# Patient Record
Sex: Female | Born: 1960 | Race: White | Hispanic: No | Marital: Married
Health system: Midwestern US, Community
[De-identification: ages and names within clinical notes are randomized; demographics above are authoritative.]

## PROBLEM LIST (undated history)

## (undated) DIAGNOSIS — M810 Age-related osteoporosis without current pathological fracture: Secondary | ICD-10-CM

## (undated) DIAGNOSIS — M62838 Other muscle spasm: Secondary | ICD-10-CM

## (undated) DIAGNOSIS — M159 Polyosteoarthritis, unspecified: Secondary | ICD-10-CM

## (undated) DIAGNOSIS — Z1239 Encounter for other screening for malignant neoplasm of breast: Secondary | ICD-10-CM

## (undated) DIAGNOSIS — M797 Fibromyalgia: Secondary | ICD-10-CM

## (undated) DIAGNOSIS — M1990 Unspecified osteoarthritis, unspecified site: Secondary | ICD-10-CM

## (undated) DIAGNOSIS — Z1231 Encounter for screening mammogram for malignant neoplasm of breast: Secondary | ICD-10-CM

## (undated) DIAGNOSIS — M1611 Unilateral primary osteoarthritis, right hip: Secondary | ICD-10-CM

## (undated) DIAGNOSIS — M519 Unspecified thoracic, thoracolumbar and lumbosacral intervertebral disc disorder: Secondary | ICD-10-CM

## (undated) DIAGNOSIS — G629 Polyneuropathy, unspecified: Secondary | ICD-10-CM

## (undated) DIAGNOSIS — E531 Pyridoxine deficiency: Secondary | ICD-10-CM

---

## 2010-01-03 LAB — CBC WITH AUTOMATED DIFF
ABS. BASOPHILS: 0.1 10*3/uL (ref 0.0–0.1)
ABS. EOSINOPHILS: 0.1 10*3/uL (ref 0.0–0.5)
ABS. LYMPHOCYTES: 1.7 10*3/uL (ref 0.8–3.5)
ABS. MONOCYTES: 0.4 10*3/uL — ABNORMAL LOW (ref 0.8–3.5)
ABS. NEUTROPHILS: 3 10*3/uL (ref 1.5–8.0)
BASOPHILS: 1 % (ref 0–2)
EOSINOPHILS: 2 % (ref 0–5)
HCT: 43 % (ref 41–53)
HGB: 14.6 g/dL (ref 12.0–16.0)
LYMPHOCYTES: 33 % (ref 19–48)
MCH: 30 PG (ref 27–31)
MCHC: 34 g/dL (ref 31–37)
MCV: 88.2 FL (ref 80–100)
MONOCYTES: 8 % (ref 3–9)
MPV: 8.2 FL (ref 5.9–10.3)
NEUTROPHILS: 56 % (ref 40–74)
PLATELET: 254 10*3/uL (ref 130–400)
RBC: 4.88 M/uL (ref 4.2–5.4)
RDW: 14.3 % (ref 11.5–14.5)
WBC: 5.3 10*3/uL (ref 4.5–10.8)

## 2010-01-03 LAB — METABOLIC PANEL, COMPREHENSIVE
A-G Ratio: 1.2 (ref 1.2–2.2)
ALT (SGPT): 38 U/L (ref 30–65)
AST (SGOT): 19 U/L (ref 15–37)
Albumin: 4.4 g/dL (ref 3.4–5.0)
Alk. phosphatase: 57 U/L (ref 50–136)
Anion gap: 9 mmol/L (ref 6–15)
BUN/Creatinine ratio: 10 (ref 7–25)
BUN: 8 MG/DL (ref 7–18)
Bilirubin, total: 0.7 MG/DL (ref 0.2–1.0)
CO2: 28 MMOL/L (ref 21–32)
Calcium: 9.4 MG/DL (ref 8.5–10.1)
Chloride: 103 MMOL/L (ref 98–107)
Creatinine: 0.8 MG/DL (ref 0.6–1.3)
GFR est AA: >60 mL/min/{1.73_m2} (ref >60)
GFR est non-AA: >60 mL/min/{1.73_m2} (ref >60)
Globulin: 3.6 g/dL — ABNORMAL HIGH (ref 2.4–3.5)
Glucose: 88 MG/DL (ref 70–110)
Potassium: 3.9 MMOL/L (ref 3.5–5.1)
Protein, total: 8 g/dL (ref 6.4–8.2)
Sodium: 140 MMOL/L (ref 136–145)

## 2010-01-03 LAB — THYROID PANEL W/TSH
Free thyroxine index: 2.4 (ref 1.4–3.1)
T3 Uptake: 29 % (ref 22.5–37)
T4, Total: 8.3 ug/dL (ref 4.5–10.9)
TSH,2nd Generation: 1.6 u[IU]/mL (ref 0.35–5.50)

## 2010-01-03 LAB — CHOLESTEROL, TOTAL: Cholesterol, total: 208 MG/DL — ABNORMAL HIGH (ref <200)

## 2010-01-03 NOTE — Op Note (Unsigned)
[facility]      PT Name: Rachael, Brown Admitted: 01/03/2010  MR#: [id number] DOB: [birth year]  Account #: [id number] Age: 49  Dictator: Joretta Bachelor, M.D. Location:          OPERATIVE REPORT    SURGERY DATE: 01/03/2010    PREOPERATIVE DIAGNOSIS:  1. Patient for screening colonoscopy.  2. Average risk.    POSTOPERATIVE DIAGNOSIS:  1. A 4 mm polyp at 30 cm, removed with biopsy forceps.  2. Internal hemorrhoids.  3. Slightly prominent ileocecal valve, biopsied.    OPERATION:  Colonoscopy to cecum with biopsy and removal of the polyp and biopsy  of the ileocecal valve.    ENDOSCOPIST: Dr. Lyndee Leo.  ANESTHESIA: MAC.    PERTINENT HISTORY T INDICATIONS FOR PROCEDURE:  This 49 year old female was seen by me for screening colonoscopy.  She is considered average risk.    PROCEDURE T FINDINGS:  The patient was kept in the left lateral position. She was sedated  by the anesthesiologist.    A rectal examination was done and this was normal. Olympus  videoendoscope was advanced into the rectum and sigmoid colon. The  preparation was adequate. Clear liquid stool was present and it  could be suctioned out. Carefully the scope was manipulated all the  way to the cecum through a slightly redundant colon. The ileocecal  valve appeared slightly prominent. Whether there was a small  submucosal lesion in this area or not, I could not be sure.  Biopsies were taken from the surface. The tissue felt soft and most  likely is slightly prominent ileocecal valve. The appendicular  orifice was identified and thereby the fact that the location was  the cecum was confirmed. The scope was withdrawn. I did not see  the openings of any diverticula. At 30 cm there was a small polyp  about 4 mm in size. This was removed using cold biopsy forceps.  The scope was withdrawn and the anorectal area was inspected by  retroflexion. Small internal hemorrhoids were noticed. After  decompressing the colon, the procedure was terminated.       Appropriate postoperative instructions were given. Periodic  surveillance will be recommended depending on the biopsy findings if  adenomatous tissue is found in the polyp.         Electronically signed by Markus Daft, M.D.  on 01/08/2010 21:57:42      _____________________________  Markus Daft, M.D.    RKW:ch DD: 01/03/2010 09:08:54 DT: 01/03/2010 11:23:03 Job ID:  [id number]  CC:

## 2010-01-03 NOTE — Op Note (Unsigned)
[facility]      PT Name: Rachael Brown, Rachael Brown Admitted: 01/03/2010  MR#: [id number] DOB: May 06, 1961  Account #: [id number] Age: 49  Dictator: Joretta Bachelor, M.D. Location:          OPERATIVE REPORT    SURGERY DATE: 01/03/2010    PREOPERATIVE DIAGNOSIS:  1. Patient for screening colonoscopy.  2. Average risk.    POSTOPERATIVE DIAGNOSIS:  1. A 4 mm polyp at 30 cm, removed with biopsy forceps.  2. Internal hemorrhoids.  3. Slightly prominent ileocecal valve, biopsied.    OPERATION:  Colonoscopy to cecum with biopsy and removal of the polyp and biopsy  of the ileocecal valve.    ENDOSCOPIST: Dr. Lyndee Leo.  ANESTHESIA: MAC.    PERTINENT HISTORY T INDICATIONS FOR PROCEDURE:  This 49 year old female was seen by me for screening colonoscopy.  She is considered average risk.    PROCEDURE T FINDINGS:  The patient was kept in the left lateral position. She was sedated  by the anesthesiologist.    A rectal examination was done and this was normal. Olympus  videoendoscope was advanced into the rectum and sigmoid colon. The  preparation was adequate. Clear liquid stool was present and it  could be suctioned out. Carefully the scope was manipulated all the  way to the cecum through a slightly redundant colon. The ileocecal  valve appeared slightly prominent. Whether there was a small  submucosal lesion in this area or not, I could not be sure.  Biopsies were taken from the surface. The tissue felt soft and most  likely is slightly prominent ileocecal valve. The appendicular  orifice was identified and thereby the fact that the location was  the cecum was confirmed. The scope was withdrawn. I did not see  the openings of any diverticula. At 30 cm there was a small polyp  about 4 mm in size. This was removed using cold biopsy forceps.  The scope was withdrawn and the anorectal area was inspected by  retroflexion. Small internal hemorrhoids were noticed. After  decompressing the colon, the procedure was terminated.       The endoscopic findings were discussed with the patient.  Appropriate postoperative instructions were given. Periodic  surveillance will be recommended depending on the biopsy findings if  adenomatous tissue is found in the polyp.        _____________________________  Markus Daft, M.D.    RKW:ch DD: 01/03/2010 09:08:54 DT: 01/03/2010 11:23:03 Job ID:  [id number]  CC:

## 2010-01-06 LAB — TRIGLYCERIDE: Triglyceride: 65 MG/DL (ref <150)

## 2010-01-06 LAB — HDL CHOLESTEROL: HDL Cholesterol: 73 MG/DL (ref 32–96)

## 2010-09-05 NOTE — Procedures (Signed)
Procedures  signed by Toniann Fail at 09/07/10 1327                 Author: Toniann Fail  Service: --  Author Type: Physician       Filed: 09/07/10 1327  Date of Service: 09/05/10 1010  Status: Signed          Editor: Toniann Fail            Procedure Orders        1. 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  She has some neck pain as well.  She had a carpal<BR> tunnel release in 1999 on the right hand.  No history of diabetes.<BR> She is frequently on a keyboard.  She has very pronounced Tinel's<BR> signs, especially on the right compared to the left.  She has had<BR> steroid injections  in her wrists at various times which generally<BR> help with her symptoms except on this last occasion when she had<BR> very little help.<BR> <BR> TECHNICAL INFORMATION:<BR> Bilateral upper extremity nerve conductions and electromyography and<BR> bilateral  cervical paraspinal electromyography was performed.<BR> <BR> RESULTS:<BR> Motor nerves-bilateral median distal motor latencies were normal at<BR> 3.5 milliseconds on the left and 3.8 milliseconds on the right.<BR> Normal is less than 4 milliseconds.   Amplitude on the left was<BR> borderline, on the right was slightly reduced.  Proximal velocities<BR> were normal at 66 meters per second on the  left, 63 meters per<BR> second on the right. Normal velocities are greater than 50 meters<BR> per second.   In contrast, ulnar distal motor latencies were 2.8<BR> milliseconds on the left, 2.6 milliseconds on the right.  Amplitudes<BR> were normal.  Proximal velocities again between the wrist and elbow<BR> were 69 meters per second on the left, 62 meters per  second on the<BR> right.  Velocities across the elbow were 71 meters per second on the<BR> left, 58 meters per second on the right.<BR> <BR> F-studies demonstrated the late responses for both median and ulnar<BR> nerves were between 24-26 milliseconds.   Normal is less than 30.<BR> Sensory peak latencies orthodromically conducted across the wrist<BR> were slightly prolonged for the left median nerve at 2.3<BR> milliseconds.  Normal is 2 milliseconds or less.  The right median<BR> nerve was borderline  at 2.1 milliseconds.  Ulnar nerves were normal<BR> at 2 milliseconds and the radial nerves at 100 mm were normal at 2.4<BR> milliseconds.<BR> <BR>  Electromyography demonstrated bilateral high, mid and low cervical<BR> paraspinal activity was normal.   There was significant tension<BR> there.  Bilateral biceps, pronator teres, abductor pollicis brevis<BR> and first dorsal interossei also had normal spontaneous activity<BR> including amplitude.<BR> <BR> <BR> <BR> <BR> <BR> <BR> <BR> <BR> IMPRESSIONS:<BR>  1. Minimal carpal tunnel syndrome sensory delay on the left.<BR> 2. No supportive evidence of a carpal tunnel syndrome on conductive<BR> basis on the right.<BR> 3. No evidence of a cervical radiculopathy.  Reduced amplitude on<BR> the right carpal tunnel  median nerve suggests a chronic injury to<BR> the nerve.  She appears to have a severe carpal tunnel clinically<BR> but without compromising electrical conduction.  Correlate<BR> clinically.<BR> <BR> <BR> <BR> <BR> ___________________________________<BR>  Toniann Fail, M.D.<BR> <BR> JB:ls  DD:  09/05/2010  10:10:08   DT:  09/05/2010 19:59:58  Job ID:<BR> [id number]<BR> CC:Dr. Warrier<BR> <BR> <BR> <BR> <BR> <BR> <BR> <BR> <BR> Document #:  [id number]<BR> <!--EPICE-->

## 2010-09-05 NOTE — Procedures (Signed)
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 She has some neck pain as well. She had a carpal  tunnel release in 1999 on the right hand. No history of diabetes.  She is frequently on a keyboard. She has very pronounced Tinel's  signs, especially on the right compared to the left. She has had  steroid injections in her wrists at various times which generally  help with her symptoms except on this last occasion when she had  very little help.    TECHNICAL INFORMATION:  Bilateral upper extremity nerve conductions and electromyography and  bilateral cervical paraspinal electromyography was performed.    RESULTS:  Motor nerves-bilateral median distal motor latencies were normal at  3.5 milliseconds on the left and 3.8 milliseconds on the right.  Normal is less than 4 milliseconds. Amplitude on the left was  borderline, on the right was slightly reduced. Proximal velocities  were normal at 66 meters per second on the left, 63 meters per  second on the right. Normal velocities are greater than 50 meters  per second. In contrast, ulnar distal motor latencies were 2.8  milliseconds on the left, 2.6 milliseconds on the right. Amplitudes  were normal. Proximal velocities again between the wrist and elbow  were 69 meters per second on the left, 62 meters per second on the  right. Velocities across the elbow were 71 meters per second on the  left, 58 meters per second on the right.    F-studies demonstrated the late responses for both median and ulnar   nerves were between 24-26 milliseconds. Normal is less than 30.  Sensory peak latencies orthodromically conducted across the wrist  were slightly prolonged for the left median nerve at 2.3  milliseconds. Normal is 2 milliseconds or less. The right median  nerve was borderline at 2.1 milliseconds. Ulnar nerves were normal  at 2 milliseconds and the radial nerves at 100 mm were normal at 2.4  milliseconds.    Electromyography demonstrated bilateral high, mid and low cervical  paraspinal activity was normal. There was significant tension  there. Bilateral biceps, pronator teres, abductor pollicis brevis  and first dorsal interossei also had normal spontaneous activity  including amplitude.                  IMPRESSIONS:  1. Minimal carpal tunnel syndrome sensory delay on the left.  2. No supportive evidence of a carpal tunnel syndrome on conductive  basis on the right.  3. No evidence of a cervical radiculopathy. Reduced amplitude on  the right carpal tunnel median nerve suggests a chronic injury to  the nerve. She appears to have a severe carpal tunnel clinically  but without compromising electrical conduction. Correlate  clinically.          ___________________________________  Toniann Fail, M.D.    JB:ls DD: 09/05/2010 10:10:08 DT: 09/05/2010 19:59:58 Job ID:  [id number]  CC:Dr. Lyndee Leo  Document #: [id number]

## 2010-09-16 MED ORDER — GADOPENTETATE DIMEGLUMINE 7.5 MMOL/15 ML IV SYRINGE
7.5 mmol/15 mL (469.01 mg/mL) | Freq: Once | INTRAVENOUS | Status: AC
Start: 2010-09-16 — End: 2010-09-16
  Administered 2010-09-16: 14:00:00 via INTRAVENOUS

## 2010-09-16 MED FILL — MAGNEVIST 7.5 MMOL/15 ML (469.01 MG/ML) INTRAVENOUS SYRINGE: 7.5 mmol/15 mL (469.01 mg/mL) | INTRAVENOUS | Qty: 15

## 2010-09-16 NOTE — Progress Notes (Signed)
Mri cervical W&WO completed @ 0805. Rachael Brown

## 2010-10-17 LAB — METABOLIC PANEL, BASIC
Anion gap: 7 mmol/L (ref 6–15)
BUN/Creatinine ratio: 16 (ref 7–25)
BUN: 11 MG/DL (ref 7–18)
CO2: 27 MMOL/L (ref 21–32)
Calcium: 8.7 MG/DL — ABNORMAL LOW (ref 9.0–10.7)
Chloride: 107 MMOL/L (ref 98–107)
Creatinine: 0.7 MG/DL (ref 0.6–1.3)
GFR est AA: >60 mL/min/{1.73_m2} (ref >60)
GFR est non-AA: >60 mL/min/{1.73_m2} (ref >60)
Glucose: 84 MG/DL (ref 70–110)
Potassium: 3.8 MMOL/L (ref 3.5–5.3)
Sodium: 141 MMOL/L (ref 136–145)

## 2010-10-17 LAB — CBC WITH AUTOMATED DIFF
ABS. BASOPHILS: 0 10*3/uL (ref 0.0–0.1)
ABS. EOSINOPHILS: 0.1 10*3/uL (ref 0.0–0.5)
ABS. LYMPHOCYTES: 1.8 10*3/uL (ref 0.8–3.5)
ABS. MONOCYTES: 0.6 10*3/uL — ABNORMAL LOW (ref 2.0–8.0)
ABS. NEUTROPHILS: 3.6 10*3/uL (ref 1.5–8.0)
BASOPHILS: 1 % (ref 0–2)
EOSINOPHILS: 1 % (ref 0–5)
HCT: 42 % (ref 41–53)
HGB: 13.4 g/dL (ref 12.0–16.0)
LYMPHOCYTES: 30 % (ref 19–48)
MCH: 28.9 PG (ref 27–31)
MCHC: 31.9 g/dL (ref 31–37)
MCV: 90.5 FL (ref 80–100)
MONOCYTES: 9 % (ref 3–9)
MPV: 10.5 FL — ABNORMAL HIGH (ref 5.9–10.3)
NEUTROPHILS: 59 % (ref 40–74)
PLATELET: 240 10*3/uL (ref 130–400)
RBC: 4.64 M/uL (ref 4.2–5.4)
RDW: 13.8 % (ref 11.5–14.5)
WBC: 6 10*3/uL (ref 4.5–10.8)

## 2010-10-17 LAB — EKG, 12 LEAD, INITIAL
Atrial Rate: 71 {beats}/min
Calculated P Axis: 10 degrees
Calculated R Axis: -10 degrees
Calculated T Axis: 51 degrees
Diagnosis: NORMAL
P-R Interval: 156 ms
Q-T Interval: 404 ms
QRS Duration: 76 ms
QTC Calculation (Bezet): 439 ms
Ventricular Rate: 71 {beats}/min

## 2010-10-17 LAB — PROTHROMBIN TIME + INR
INR: 1 (ref 0.9–1.1)
Prothrombin time: 13.1 s (ref 11.3–14.6)

## 2010-10-17 LAB — PTT: aPTT: 29.3 s (ref 22.6–40.3)

## 2010-10-17 NOTE — Progress Notes (Signed)
CHEST XRAY COMPLETED @ 1000  Rachael Brown

## 2010-10-20 ENCOUNTER — Observation Stay
Admit: 2010-10-20 | Discharge: 2010-10-21 | Disposition: A | Discharge status: Home / Self Care | Payer: BLUE CROSS/BLUE SHIELD | Source: Ambulatory Visit | Attending: Neurological Surgery | Admitting: Neurological Surgery

## 2010-10-20 DIAGNOSIS — M5000 Cervical disc disorder with myelopathy, unspecified cervical region: Secondary | ICD-10-CM

## 2010-10-20 LAB — BLOOD TYPE, (ABO+RH)
ABO/Rh(D): A POS
ABO/Rh: A POS

## 2010-10-20 MED ORDER — BACITRACIN 50,000 UNIT IM
50000 unit | Freq: Once | INTRAMUSCULAR | Status: AC
Start: 2010-10-20 — End: 2010-10-20
  Administered 2010-10-20: 21:00:00

## 2010-10-20 MED ORDER — ONDANSETRON (PF) 4 MG/2 ML INJECTION
4 mg/2 mL | INTRAMUSCULAR | Status: AC | PRN
Start: 2010-10-20 — End: 2010-10-20
  Administered 2010-10-20: 20:00:00 via INTRAVENOUS

## 2010-10-20 MED ORDER — SODIUM CHLORIDE 0.9 % IRRIGATION SOLN
0.9 % | Freq: Once | Status: AC
Start: 2010-10-20 — End: 2010-10-20
  Administered 2010-10-20: 21:00:00

## 2010-10-20 MED ORDER — MIDAZOLAM 1 MG/ML IJ SOLN
1 mg/mL | INTRAMUSCULAR | Status: DC | PRN
Start: 2010-10-20 — End: 2010-10-20
  Administered 2010-10-20: 20:00:00 via INTRAVENOUS

## 2010-10-20 MED ORDER — LACTATED RINGERS IV
INTRAVENOUS | Status: DC
Start: 2010-10-20 — End: 2010-10-20
  Administered 2010-10-20: 18:00:00 via INTRAVENOUS

## 2010-10-20 MED ORDER — SODIUM CHLORIDE 0.9 % IV PIGGY BACK
1 gram | Freq: Once | INTRAVENOUS | Status: AC
Start: 2010-10-20 — End: 2010-10-20
  Administered 2010-10-20: 20:00:00 via INTRAVENOUS

## 2010-10-20 MED FILL — LACTATED RINGERS IV: INTRAVENOUS | Qty: 1000

## 2010-10-20 MED FILL — SODIUM CHLORIDE 0.9 % IRRIGATION SOLN: 0.9 % | Qty: 500

## 2010-10-20 MED FILL — BACITRACIN 50,000 UNIT IM: 50000 unit | INTRAMUSCULAR | Qty: 50000

## 2010-10-20 MED FILL — FENTANYL CITRATE (PF) 50 MCG/ML IJ SOLN: 50 mcg/mL | INTRAMUSCULAR | Qty: 5

## 2010-10-20 MED FILL — ONDANSETRON (PF) 4 MG/2 ML INJECTION: 4 mg/2 mL | INTRAMUSCULAR | Qty: 2

## 2010-10-20 MED FILL — PROPOFOL 10 MG/ML IV EMUL: 10 mg/mL | INTRAVENOUS | Qty: 20

## 2010-10-20 MED FILL — HYDROMORPHONE (PF) 1 MG/ML IJ SOLN: 1 mg/mL | INTRAMUSCULAR | Qty: 1

## 2010-10-20 MED FILL — SODIUM CHLORIDE 0.9 % IV PIGGY BACK: INTRAVENOUS | Qty: 50

## 2010-10-20 MED FILL — SODIUM CHLORIDE 0.9 % IRRIGATION SOLN: 0.9 % | Qty: 1500

## 2010-10-20 MED FILL — LIDOCAINE (PF) 20 MG/ML (2 %) IV SYRINGE: 100 mg/5 mL (2 %) | INTRAVENOUS | Qty: 5

## 2010-10-20 MED FILL — ROCURONIUM 10 MG/ML IV: 10 mg/mL | INTRAVENOUS | Qty: 5

## 2010-10-20 MED FILL — MIDAZOLAM 1 MG/ML IJ SOLN: 1 mg/mL | INTRAMUSCULAR | Qty: 2

## 2010-10-20 NOTE — Op Note (Signed)
Op Notes signed by  at 10/26/10 1624                  Author: Nolon Nations  Service: --  Author Type: Physician       Filed: 10/26/10 1624  Date of Service: 10/20/10 2316  Status: Signed          Editor: Nolon Nations          <!--EPICS-->                           [facility]<BR> <BR> <BR> PT Name:  Rachael Brown, Rachael Brown      Admitted:  10/20/2010<BR> MR#:  [id number]                      DOB:  [birth year]<BR> Account #:  [id number]            Age:  49<BR> Dictator:  Nolon Nations, M.D.       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 C3-4 anterior  cervical discectomy with arthrodesis using right<BR> iliac crest  graft with Cornerstone HSR cage and Atlantis Vision<BR> plating<BR> 2. C5-6 anterior cervical discectomy with arthrodesis using right<BR> iliac crest  graft with Cornerstone HSR cage and  separate plating<BR> 3. Neural Integrity Monitoring times 6 1/2 hours of the vocal cords<BR> <BR> SURGEON:  Dr. Shela Commons. Lytle Malburg<BR> ANES:   General endotracheal anesthesia<BR> EBL:  225 cc<BR> CONDITION:  The patient tolerated the procedure well and was taken<BR>  to the Recovery Room with notable improvement in strength in both<BR> upper extremities and moving all extremities well.<BR> <BR> INDICATIONS FOR PROCEDURE:<BR> This middle aged [place] female has had three years of progressive<BR> symptoms but getting  medically worse in the last 3-6 months with<BR> having to use two hands to hold her coffee cup, progressive weakness<BR> in both arms and ataxic gait.  Work up revealed cord compression at<BR> C3-5 consistent with C5-8 on one side and C6-8 on the other  side<BR> with neurological  deficits in these zones with impaired<BR> proprioception and difficulty with tandem walking and positive<BR> Romberg.   The risks, benefits, alternatives and complications of<BR> surgical management were explained to the patient  and her husband.<BR> She elected to proceed accordingly.  With normal bone mass she<BR> elected autograft over alllograft.  No guarantees regarding results<BR> were given.<BR> <BR> DESCRIPTION OF OPERATIVE PROCEDURE:<BR> The patient was taken to the Operating  Room and placed under General<BR> endotracheal anesthesia.  Neural integrity monitoring was<BR> established through the endotracheal tube and found to be normal<BR> throughout the entire monitoring period.   Foley catheter,<BR> sequential compression  boots and pulse oximetry monitoring were<BR> placed.  Shoulders were lightly taped down for intraoperative<BR> visualization.   The patient had previous history of rotator cuff<BR> repair and thus light taping only was performed.  The shoulders were<BR>  untaped after initial cross table fluoroscopy.  The neck and right<BR> iliac crest area were prepped thoroughly with Betadine containing<BR> solution and draped in sterile fashion.<BR> <BR> After surgical pause was observed an incision was made in the  normal<BR> nuchal fold overlying the area of interest from midline to past the<BR> sternocleidomastoid border on the right hand side.  The incision was<BR> carried down through subcutaneous tissue.  The platysma muscle  was<BR> incised.   The anterior  border of the sternocleidomastoid muscle was<BR> identified and incised.   Blunt dissection was carried past the<BR> carotid sheath.  Several bridging veins were bipolar cauterized and<BR> divided.  The prevertebral fascia was opened from C3 to C6.    The<BR> longus coli muscle was elevated bilaterally from C3-4 and from C5-6.<BR> Self-retaining Shadow-Line retractor was inserted underneath the<BR> longus coli muscle and was adjusted for each  level accordingly.<BR> Then, 12 mm bone wax coated Caspar  distractor pins were placed into<BR> the respective bodies of C3-4 and C5-6.  These pins were not buried<BR> due to the shallow nature of the small vertebral bodies.  The<BR> confirmatory radiograph showed the pins in good position.<BR> <BR> Starting  first at C3-4 the disc, which was degenerative and narrowed<BR> in nature, was incised after distracting the disc partially.   The<BR> disc was removed in a piecemeal fashion back to the posterior narrow<BR> aspect of the disc.    Attention was then directed  to the C5-6 level<BR> where the process was repeated.   This disc was irregular and<BR> narrowed as well.  It had a lordotic curve within the disc itself.<BR> Bone was removed from the anterior osteophytes, preserving some bone<BR> intact for later screw  placement.   The disc had been removed to<BR> approximately two-thirds of the way because of the contour change of<BR> the disc.   The disc was then drilled on the bone edges to form<BR> parallel cuts.   Extensive bone bridging was noted after the<BR>  posterior aspect of the disc was encountered.   These were drilled<BR> thin with Midas Rex drilling.   Decompression corresponded to<BR> findings on the MRI with extensive bony changes in the ligament,<BR> sometimes extending behind the vertebral body  on both sides.  This<BR> required extensive drilling of osteophytes overlying said ossified<BR> ligaments until the ligament was completely free and all ossified<BR> and thickened portion of the ligament and the dura, though still<BR> compressed, until  all of these components were completely removed.<BR> Bilateral foraminotomies were completed so that a 3.0 angle curette<BR> could pass along the anterior margin of the nerve root superiorly,<BR> laterally and inferiorly.<BR> <BR> Attention was then directed  back to the C3-4 disc where the<BR> posterior aspect of the disc was removed under Leica OH4  microscope<BR> as had been detailed on the previously aforementioned disc.   Midas<BR> Rex drilling was used to reduce the osteophytes extending off of<BR> both  vertebral bodies which were more extensive centrally and to the<BR> right side.   The foramen were opened bilaterally.  There was<BR> extensive ossification compressing the right foramen.   This was<BR> thoroughly decompressed.  Any dural compression  was completely<BR> removed.   Epidural oozing was controlled with the use of judicious<BR> amounts of Avitene.   All irrigation returned clear.  End-plate<BR> preparation had been made.<BR> <BR> A separate incision was made over the iliac crest on the  right side<BR> and carried down to subcutaneous tissue.   Superficial fascia and<BR> deep fascia were incised.   The cortex was opened and adequate<BR> cancellous bone to pack two separate cages was obtained and placed<BR> in Bacitracin containing solution.   A 6 x 11 x 14 mm Cornerstone HSR<BR> cage was packed to over-flowing with cancellous bone.   It was<BR> tamped gently but firmly into position.  Attempts to pry it loose<BR> once the distractor pins were released were unsuccessful.   The two<BR> pins  were removed and bone wax applied to the  holes.   A 21 mm<BR> Atlantis Vision plate was attached to the vertebral bodies with<BR> slight contouring because of the slip-off of the C3 vertebral body.<BR> Because of the small size of the vertebral bodies,  11 mm x 4.0 mm<BR> variable angle screws were used.<BR> A pilot hole was made with an 11 mm drill bit.  Two screws were<BR> placed per level and the locking mechanisms were engaged.  Attempts<BR> to back out any of the cancellous screws were unsuccessful.<BR>  <BR> Attention was then directed back to the C5-6 area.  After attaining<BR> end-plate preparation, a second 6 x 11 x 14 mm Cornerstone HSR cage<BR> was packed to over-flowing.   This was tamped gently but firmly into<BR> position.  Attempts to pry it   loose were unsuccessful.  Initial<BR> placement of the screws showed the 21 mm Atlantis Vision plate was<BR> not fitting well against the vertebral bodies.   These screws were<BR> then removed in order to gain access to the left C5 screw and right<BR>  C6 screw.  Then, 13 mm drill was used.  This was done under<BR> fluoroscopy to make sure that it did not engage beyond the bony<BR> limits.  This allowed for placement of a second set of 4.0 x 11 mm<BR> variable angle screws into the vertebral bodies  of C5-6<BR> respectively.   The locking mechanisms were engaged and attempts to<BR> back out any of the cancellous screws were unsuccessful.<BR> Examination of the esophagus was performed and showed to be intact.<BR> <BR> Both wounds were thoroughly irrigated  with Bacitracin containing<BR> solution.  The hip wound was closed without need of a drain in<BR> anatomical layers including 4-0 subcuticular closure.  The cervical<BR> wound was closed over a separate stab site for a 7 mm flat<BR> Jackson-Pratt drain  taken from the prevertebral fascia out to it's<BR> own bulb suction.  The wound was then closed in anatomical layers of<BR> Vicryl including 4-0 subcuticular closure.   Steri-Strips and<BR> dressing were applied to both wounds.  Soft collar was applied.<BR>  The patient was extubated with notable improvement in bilateral<BR> upper extremity strength in the Recovery Room.    Sponge and needle<BR> counts were correct.  Estimated blood loss was 225 cc.<BR> <BR> <BR> <BR> <BR> <BR> <BR> <BR> <BR> <BR> _____________________________<BR>  Nolon Nations, M.D.<BR> <BR> JP:klg DD: 10/20/2010 23:16:52  DT: 10/21/2010 11:38:03 Job ID:<BR> [id number]<BR> CC:<BR> <BR> <BR> Document #:  [id number]<BR> <!--EPICE-->

## 2010-10-20 NOTE — Progress Notes (Signed)
Post-Anesthesia Evaluation & Assessment    Visit Vitals   Item Reading   ??? BP 142/93   ??? Pulse 101   ??? Temp 98.4 ??F (36.9 ??C)   ??? Resp 22   ??? Ht 5\' 2"  (1.575 m)   ??? Wt 175 lb (79.379 kg)   ??? BMI 32.01 kg/m2   ??? SpO2 100%         Nausea/Vomiting: no nausea    Post-operative hydration adequate.    Pain score (VAS): 1    Mental status & Level of consciousness: alert and oriented x 3    Neurological status: moves all extremities, sensation grossly intact    Pulmonary status: airway patent, no supplemental oxygen required    Complications related to anesthesia: none    Patient has met all discharge requirements.    Additional comments:        [place] Circle, MD

## 2010-10-20 NOTE — Other (Signed)
TRANSFER - OUT REPORT:    Verbal report given to angie R.N.(name) on Rachael Brown  being transferred to 4c(unit) for routine progression of care       Report consisted of patient???s Situation, Background, Assessment and   Recommendations(SBAR).     Information from the following report(s) SBAR was reviewed with the receiving nurse.    Opportunity for questions and clarification was provided.

## 2010-10-20 NOTE — Op Note (Signed)
[facility]      PT Name: Rachael Brown, Rachael Brown Admitted: 10/20/2010  MR#: [id number] DOB: [birth year]  Account #: [id number] Age: 50  Dictator: Nolon Nations, M.D. Location:      OPERATIVE REPORT    SURGERY DATE: 10/20/2010    PREOPERATIVE DIAGNOSIS:  Cervical degenerative disc disease, cervical herniated nucleus  pulposus, cervical myelopathy    POSTOPERATIVE DIAGNOSIS:  Cervical degenerative disc disease, cervical herniated nucleus  pulposus, cervical myelopathy    OPERATION:  1. C3-4 anterior cervical discectomy with arthrodesis using right  iliac crest graft with Cornerstone HSR cage and Atlantis Vision  plating  2. C5-6 anterior cervical discectomy with arthrodesis using right  iliac crest graft with Cornerstone HSR cage and separate plating  3. Neural Integrity Monitoring times 6 1/2 hours of the vocal cords    SURGEON: Dr. Arliss Journey  ANES: General endotracheal anesthesia  EBL: 225 cc  CONDITION: The patient tolerated the procedure well and was taken  to the Recovery Room with notable improvement in strength in both  upper extremities and moving all extremities well.    INDICATIONS FOR PROCEDURE:  This middle aged [place] female has had three years of progressive  symptoms but getting medically worse in the last 3-6 months with  having to use two hands to hold her coffee cup, progressive weakness  in both arms and ataxic gait. Work up revealed cord compression at  C3-5 consistent with C5-8 on one side and C6-8 on the other side  with neurological deficits in these zones with impaired  proprioception and difficulty with tandem walking and positive  Romberg. The risks, benefits, alternatives and complications of  surgical management were explained to the patient and her husband.  She elected to proceed accordingly. With normal bone mass she  elected autograft over alllograft. No guarantees regarding results  were given.    DESCRIPTION OF OPERATIVE PROCEDURE:   The patient was taken to the Operating Room and placed under General  endotracheal anesthesia. Neural integrity monitoring was  established through the endotracheal tube and found to be normal  throughout the entire monitoring period. Foley catheter,  sequential compression boots and pulse oximetry monitoring were  placed. Shoulders were lightly taped down for intraoperative  visualization. The patient had previous history of rotator cuff  repair and thus light taping only was performed. The shoulders were  untaped after initial cross table fluoroscopy. The neck and right  iliac crest area were prepped thoroughly with Betadine containing  solution and draped in sterile fashion.    After surgical pause was observed an incision was made in the normal  nuchal fold overlying the area of interest from midline to past the  sternocleidomastoid border on the right hand side. The incision was  carried down through subcutaneous tissue. The platysma muscle was  incised. The anterior border of the sternocleidomastoid muscle was  identified and incised. Blunt dissection was carried past the  carotid sheath. Several bridging veins were bipolar cauterized and  divided. The prevertebral fascia was opened from C3 to C6. The  longus coli muscle was elevated bilaterally from C3-4 and from C5-6.  Self-retaining Shadow-Line retractor was inserted underneath the  longus coli muscle and was adjusted for each level accordingly.  Then, 12 mm bone wax coated Caspar distractor pins were placed into  the respective bodies of C3-4 and C5-6. These pins were not buried  due to the shallow nature of the small vertebral bodies. The  confirmatory radiograph showed the pins  in good position.    Starting first at C3-4 the disc, which was degenerative and narrowed  in nature, was incised after distracting the disc partially. The  disc was removed in a piecemeal fashion back to the posterior narrow   aspect of the disc. Attention was then directed to the C5-6 level  where the process was repeated. This disc was irregular and  narrowed as well. It had a lordotic curve within the disc itself.  Bone was removed from the anterior osteophytes, preserving some bone  intact for later screw placement. The disc had been removed to  approximately two-thirds of the way because of the contour change of  the disc. The disc was then drilled on the bone edges to form  parallel cuts. Extensive bone bridging was noted after the  posterior aspect of the disc was encountered. These were drilled  thin with Midas Rex drilling. Decompression corresponded to  findings on the MRI with extensive bony changes in the ligament,  sometimes extending behind the vertebral body on both sides. This  required extensive drilling of osteophytes overlying said ossified  ligaments until the ligament was completely free and all ossified  and thickened portion of the ligament and the dura, though still  compressed, until all of these components were completely removed.  Bilateral foraminotomies were completed so that a 3.0 angle curette  could pass along the anterior margin of the nerve root superiorly,  laterally and inferiorly.    Attention was then directed back to the C3-4 disc where the  posterior aspect of the disc was removed under Leica OH4 microscope  as had been detailed on the previously aforementioned disc. Midas  Rex drilling was used to reduce the osteophytes extending off of  both vertebral bodies which were more extensive centrally and to the  right side. The foramen were opened bilaterally. There was  extensive ossification compressing the right foramen. This was  thoroughly decompressed. Any dural compression was completely  removed. Epidural oozing was controlled with the use of judicious  amounts of Avitene. All irrigation returned clear. End-plate  preparation had been made.     A separate incision was made over the iliac crest on the right side  and carried down to subcutaneous tissue. Superficial fascia and  deep fascia were incised. The cortex was opened and adequate  cancellous bone to pack two separate cages was obtained and placed  in Bacitracin containing solution. A 6 x 11 x 14 mm Cornerstone HSR  cage was packed to over-flowing with cancellous bone. It was  tamped gently but firmly into position. Attempts to pry it loose  once the distractor pins were released were unsuccessful. The two  pins were removed and bone wax applied to the holes. A 21 mm  Atlantis Vision plate was attached to the vertebral bodies with  slight contouring because of the slip-off of the C3 vertebral body.  Because of the small size of the vertebral bodies, 11 mm x 4.0 mm  variable angle screws were used.  A pilot hole was made with an 11 mm drill bit. Two screws were  placed per level and the locking mechanisms were engaged. Attempts  to back out any of the cancellous screws were unsuccessful.    Attention was then directed back to the C5-6 area. After attaining  end-plate preparation, a second 6 x 11 x 14 mm Cornerstone HSR cage  was packed to over-flowing. This was tamped gently but firmly into  position. Attempts to  pry it loose were unsuccessful. Initial  placement of the screws showed the 21 mm Atlantis Vision plate was  not fitting well against the vertebral bodies. These screws were  then removed in order to gain access to the left C5 screw and right  C6 screw. Then, 13 mm drill was used. This was done under  fluoroscopy to make sure that it did not engage beyond the bony  limits. This allowed for placement of a second set of 4.0 x 11 mm  variable angle screws into the vertebral bodies of C5-6  respectively. The locking mechanisms were engaged and attempts to  back out any of the cancellous screws were unsuccessful.  Examination of the esophagus was performed and showed to be intact.     Both wounds were thoroughly irrigated with Bacitracin containing  solution. The hip wound was closed without need of a drain in  anatomical layers including 4-0 subcuticular closure. The cervical  wound was closed over a separate stab site for a 7 mm flat  Jackson-Pratt drain taken from the prevertebral fascia out to it's  own bulb suction. The wound was then closed in anatomical layers of  Vicryl including 4-0 subcuticular closure. Steri-Strips and  dressing were applied to both wounds. Soft collar was applied.  The patient was extubated with notable improvement in bilateral  upper extremity strength in the Recovery Room. Sponge and needle  counts were correct. Estimated blood loss was 225 cc.                    _____________________________  Nolon Nations, M.D.    JP:klg DD: 10/20/2010 23:16:52 DT: 10/21/2010 11:38:03 Job ID:  [id number]  CC:      Document #: [id number]

## 2010-10-20 NOTE — Brief Op Note (Signed)
BRIEF OPERATIVE NOTE    Date of Procedure: 10/20/2010   Preoperative Diagnosis: CERVICAL HERINATED DISC, RADICULOPATHY WITH MYELOPATHY  Postoperative Diagnosis: CERVICAL HERINATED DISC, RADICULOPATHY WITH MYELOPATHY    Procedure:  SPINE ANTERIOR CERVICAL DISCECTOMY - C3-4, C5-6  ANTERIOR CERVICAL DISCECTOMIES WITH RIGHT ILIAC GRAFT, POLYMER CAGES, PLATING X 2 AND NIM VOCAL CORD MONITORING X 6 1/2 hrs    Surgeon: Nolon Nations, MD  Assistant(s): Chuckie Mccathern   Anesthesia: General   Estimated Blood Loss: 225 cc  Specimens:   ID Type Source Tests Collected by Time Destination   1 : C3-4, C5-6 Disc Material Preservative Spine  Nolon Nations 10/20/2010 1608 Pathology   1 : cath urine Urine Foley Specimen ROUTINE URINALYSIS Nolon Nations 10/20/2010 1608 Microbiology      Findings: See full operative note.  Complications: none  Implants:   Implant Name Type Inv. Item Serial No. Manufacturer Lot No. LRB No. Used Action   SPACER CERV CRNRSTN [id number] - [id number]  SPACER CERV CRNRSTN [id number]  [employer] SOF[employer]OR DANEK [id number] N/A 1 Implanted   SPACER CERV CRNRSTN [id number] - [id number]  SPACER CERV CRNRSTN 6X14X11MM  [employer] SOFAMOR DANEK [id number] N/A 1 Implanted   SCR SPNE CANC ST VA 4X11MM TI - [id number]  SCR SPNE CANC ST VA 4X11MM TI  [employer] SOFAMOR DANEK 4/4 N/A 6 Implanted   SCR SPNE CANC ST VA 4X11MM TI - [id number]  SCR SPNE CANC ST VA 4X11MM TI  MEDTRONIC SOFAMOR DANEK 3/3 N/A 2 Implanted   PLT ANT CERV VISN TI - [id number]   PLT ANT CERV VISN TI   MEDTRONIC SOFAMOR DANEK 4/4 N/A 2 Implanted

## 2010-10-20 NOTE — Progress Notes (Signed)
Arrived to 401, awake, alert, oriented. Hob elevated.  dsg d/i. Grips strong and equal.  No complaints at this time.  Accompanied by family.  V.s.  Stable.

## 2010-10-20 NOTE — H&P (Signed)
[facility]      PT Name: Rachael Brown, Dehaas Admitted: 10/20/2010  MR#: [id number] DOB: 18-Sep-1961  Account #: [id number] Age: 50  Dictator: Nolon Nations, M.D. Location: OR      HISTORY AND PHYSICAL    HISTORY OF PRESENT ILLNESS:  This is a 50 year old white female who presents with numbness and  tingling in her right dominant hand more than the left involving the  second and third digits extending all the way to the fifth digits,  dropping objects frequently, getting worse over the past three  years. In the past six months she has had to use two hands to hold  her coffee and constantly dropping out of her right hand. She has  developed a stagger ataxic gait that has been progressive in nature.    PAST MEDICAL HISTORY:  Noncontributory.    SOCIAL HISTORY:  Married. Husband is a smoker, but has agreed to quit.    REVIEW OF SYSTEMS:  Noncontributory.    CLINICAL EXAMINATION:  This is a pleasant white female with head held in a forward flexed  position. She has decreased range of motion particularly in turning  to the left. There is no motor weakness in the deltoids, internal  or external rotators. There is weakness in the biceps, triceps,  supinator and pronator, all extensors of the thumb through the fifth  digit bilaterally. The only place that there is not weakness in the  upper arms is in the flexors of the index fingers. Light touch and  pin prick are variable. Proprioception is impaired at 15 degrees in  both lower extremities otherwise motor, sensory and reflex in the  lower extremities are within normal limits. Biceps reflex is  diminished on the right side. There is difficulty with tandem  walking from backwards and forwards and a positive Romberg.    ASSESSMENT:  Cervical disc disease with radiculopathy and myelopathy.    PLAN:  Anterior cervical surgical management. The patient has chosen  autograft over allograft.        _ _____________________________________  Nolon Nations, M.D.     JP:sp DD: 10/20/2010 07:30:53 DT: 10/20/2010 07:53:24 Job ID:  [id number]  CC:          Document #: [id number]

## 2010-10-21 LAB — TYPE + CROSSMATCH
ABO/Rh(D): A POS
Antibody screen: POSITIVE
Unit division: 0
Unit division: 0

## 2010-10-21 LAB — URINALYSIS W/ RFLX MICROSCOPIC
Bilirubin: NEGATIVE
Blood: NEGATIVE
Glucose: NEGATIVE MG/DL
Ketone: NEGATIVE MG/DL
Leukocyte Esterase: NEGATIVE
Nitrites: NEGATIVE
Protein: NEGATIVE MG/DL
Specific gravity: 1.01 (ref 1.002–1.030)
Urobilinogen: 0.2 EU/DL (ref 0–1)
pH (UA): 7.5 (ref 4.5–8.0)

## 2010-10-21 MED ORDER — MORPHINE 10 MG/ML INJ SOLUTION
10 mg/ml | INTRAMUSCULAR | Status: DC | PRN
Start: 2010-10-21 — End: 2010-10-20

## 2010-10-21 MED ORDER — HYDROCODONE-ACETAMINOPHEN 5 MG-325 MG TAB
5-325 mg | ORAL_TABLET | ORAL | Status: DC | PRN
Start: 2010-10-21 — End: 2013-04-10

## 2010-10-21 MED ORDER — THROMBIN 20,000 UNIT TOPICAL KIT
20000 unit | Freq: Once | CUTANEOUS | Status: AC
Start: 2010-10-21 — End: 2010-10-20
  Administered 2010-10-21: via TOPICAL

## 2010-10-21 MED ORDER — ESOMEPRAZOLE MAGNESIUM 40 MG CAP, DELAYED RELEASE
40 mg | Freq: Every day | ORAL | Status: DC
Start: 2010-10-21 — End: 2010-10-21
  Administered 2010-10-21: 14:00:00 via ORAL

## 2010-10-21 MED ORDER — DROPERIDOL 2.5 MG/ML IJ SOLN
2.5 mg/mL | Freq: Two times a day (BID) | INTRAMUSCULAR | Status: DC | PRN
Start: 2010-10-21 — End: 2010-10-20

## 2010-10-21 MED ADMIN — HYDROcodone-acetaminophen (NORCO) 5-325 mg per tablet 1 Tab: ORAL | @ 10:00:00 | NDC 00406036562

## 2010-10-21 MED ADMIN — tiZANidine (ZANAFLEX) tablet 4 mg: ORAL | @ 14:00:00 | NDC 68084001311

## 2010-10-21 MED ADMIN — morphine (PF) PCA 30mg/30mL infusion: INTRAVENOUS | @ 05:00:00 | NDC 00409202902

## 2010-10-21 MED ADMIN — ceFAZolin (ANCEF) 1 g in 0.9% sodium chloride (MBP/ADV) 50 mL MBP: INTRAVENOUS | @ 14:00:00 | NDC 63323023710

## 2010-10-21 MED ADMIN — lactated ringers infusion: INTRAVENOUS | @ 05:00:00 | NDC 00409795309

## 2010-10-21 MED ADMIN — tiZANidine (ZANAFLEX) tablet 4 mg: ORAL | @ 05:00:00 | NDC 68084001311

## 2010-10-21 MED ADMIN — aspirin chewable tablet 81 mg: ORAL | @ 14:00:00 | NDC 63739043401

## 2010-10-21 MED ADMIN — ceFAZolin (ANCEF) 1 g in 0.9% sodium chloride (MBP/ADV) 50 mL MBP: INTRAVENOUS | @ 07:00:00 | NDC 63323023710

## 2010-10-21 MED ADMIN — 0.9% sodium chloride infusion: @ 14:00:00 | NDC 00409798302

## 2010-10-21 MED FILL — NEXIUM 40 MG CAPSULE,DELAYED RELEASE: 40 mg | ORAL | Qty: 1

## 2010-10-21 MED FILL — CEFAZOLIN 1 GRAM SOLUTION FOR INJECTION: 1 gram | INTRAMUSCULAR | Qty: 1000

## 2010-10-21 MED FILL — SODIUM CHLORIDE 0.9 % IV: INTRAVENOUS | Qty: 250

## 2010-10-21 MED FILL — TIZANIDINE 4 MG TAB: 4 mg | ORAL | Qty: 1

## 2010-10-21 MED FILL — ASPIRIN 81 MG CHEWABLE TAB: 81 mg | ORAL | Qty: 1

## 2010-10-21 MED FILL — LACTATED RINGERS IV: INTRAVENOUS | Qty: 1000

## 2010-10-21 MED FILL — THROMBIN-JMI 20,000 UNIT TOPICAL SPRAY: 20000 unit | CUTANEOUS | Qty: 1

## 2010-10-21 MED FILL — MORPHINE (PF) 30 MG/30 ML INFUSION: 30 mg/ mL (1 mg/mL) | INTRAVENOUS | Qty: 30

## 2010-10-21 MED FILL — HYDROCODONE-ACETAMINOPHEN 5 MG-325 MG TAB: 5-325 mg | ORAL | Qty: 1

## 2010-10-21 NOTE — Progress Notes (Signed)
Awake, pca started.  C/o mild pain and stiffness.  Gave flexeril.  Husband at bedside.

## 2010-10-21 NOTE — Progress Notes (Signed)
Awake, resting quietly at this time.  Pleasant and cooperative.  Denies pain or discomfort.

## 2010-10-21 NOTE — Progress Notes (Signed)
Gave verbal report to [employer].

## 2010-10-21 NOTE — Progress Notes (Signed)
VSS., afeb. Motor is 5/5 BUE with proprioception <5 degrees and NL sensation. D/C instructions given to pleased pt and husb.

## 2010-10-21 NOTE — Progress Notes (Signed)
Awake,  D/c pca.  Gave 1 lortab.  jp drain removed intact, and without difficulty.  No bleeding from site.  Ted hose applied.  Assist patient to get dressed.  Ambulated to bathroom to void.  Ambulated in hall.

## 2010-10-21 NOTE — Progress Notes (Signed)
Location: [facility] - 40101  Attn.: Nolon Nations  DOB: 1960-12-22 / Age: 50  MR#: [id number] / Admit#: [id number]  Pt. First Name: Rachael  Pt. Last Name: Georgeanna Harrison Brown  [facility]  [place], [place] [address]   Case Management - Progress Note  Initial Open Date: 10/21/2010  Case Manager:    Initial Open Date:  Social Worker: Everett Graff, MSW, CSW  Expected Date of Discharge:  Transferred From: Home  ECF Bed Held Until:  Bed Held By:  Power of Attorney:  POA/Guardian/Conservator Capacity:  Primary Caregiver: self  Living Arrangements: Lives with spouse  Source of Income: Employed  Payee:  Psychosocial History:  Cultural/Religious/Language Issues:  Education Level:  ADLS/Current Living Arrangements Issues: appears to live with spouse and be  independent with ADLS  Past Providers:  Will patient perform self care at discharge?  Anticipated Discharge Disposition Goal:  Assessment/Plan:      10/21/2010 07:59A Chart screened for discharge planning needs per Case  Management protocol. Based on the information currently available in  medical record, no intervention is required from this department at this  time as there are no needs identified. CSW/CM available to assist PRN  should needs arise. ----------- Nadine Counts, MSW, CSW    Resources at Discharge:  Service Providers at Discharge:  Dictating Provider:

## 2010-10-21 NOTE — Progress Notes (Signed)
Hob elevated.  Respirations easy and even.  No s/s of acute distress or discomfort. Husband at bedside.

## 2010-10-21 NOTE — Progress Notes (Signed)
Awake, hob elevated. No voiced complaints.

## 2010-12-04 NOTE — Progress Notes (Signed)
CERVICAL SPINE COMPLETE @ 1021 WITH NO COMPLICATIONS.  [name] RT(R),.

## 2010-12-23 NOTE — Progress Notes (Signed)
Screening mammogram performed without complication.

## 2011-01-15 NOTE — Progress Notes (Signed)
Quick Note:    Results reviewed c pt  ______

## 2011-07-28 LAB — CBC WITH AUTOMATED DIFF
ABS. BASOPHILS: 0 10*3/uL (ref 0.0–0.1)
ABS. EOSINOPHILS: 0.1 10*3/uL (ref 0.0–0.5)
ABS. LYMPHOCYTES: 2 10*3/uL (ref 0.8–3.5)
ABS. MONOCYTES: 0.4 10*3/uL — ABNORMAL LOW (ref 0.8–3.5)
ABS. NEUTROPHILS: 4.4 10*3/uL (ref 1.5–8.0)
BASOPHILS: 0 % (ref 0–2)
EOSINOPHILS: 1 % (ref 0–5)
HCT: 41.7 % (ref 41–53)
HGB: 13.5 g/dL (ref 12.0–16.0)
LYMPHOCYTES: 29 % (ref 19–48)
MCH: 29.4 PG (ref 27–31)
MCHC: 32.4 g/dL (ref 31–37)
MCV: 90.8 FL (ref 80–100)
MONOCYTES: 6 % (ref 3–9)
MPV: 11.1 FL — ABNORMAL HIGH (ref 5.9–10.3)
NEUTROPHILS: 64 % (ref 40–74)
PLATELET: 274 10*3/uL (ref 130–400)
RBC: 4.59 M/uL (ref 4.2–5.4)
RDW: 14.1 % (ref 11.5–14.5)
WBC: 6.9 10*3/uL (ref 4.5–10.8)

## 2011-07-28 LAB — METABOLIC PANEL, COMPREHENSIVE
A-G Ratio: 1.2 (ref 1.2–2.2)
ALT (SGPT): 27 U/L (ref 12–78)
AST (SGOT): 18 U/L (ref 15–37)
Albumin: 4.2 g/dL (ref 3.4–5.0)
Alk. phosphatase: 64 U/L (ref 50–136)
Anion gap: 10 mmol/L (ref 6–15)
BUN/Creatinine ratio: 28 — ABNORMAL HIGH (ref 7–25)
BUN: 17 MG/DL (ref 7–18)
Bilirubin, total: 0.3 MG/DL (ref <0.8)
CO2: 27 MMOL/L (ref 21–32)
Calcium: 9.4 MG/DL (ref 8.5–10.1)
Chloride: 103 MMOL/L (ref 98–107)
Creatinine: 0.6 MG/DL (ref 0.6–1.3)
GFR est AA: >60 mL/min/{1.73_m2} (ref >60)
GFR est non-AA: >60 mL/min/{1.73_m2} (ref >60)
Globulin: 3.6 g/dL — ABNORMAL HIGH (ref 2.4–3.5)
Glucose: 100 MG/DL (ref 70–110)
Potassium: 4.4 MMOL/L (ref 3.5–5.3)
Protein, total: 7.8 g/dL (ref 6.4–8.2)
Sodium: 140 MMOL/L (ref 136–145)

## 2011-07-28 LAB — TSH 3RD GENERATION: TSH: 1.04 u[IU]/mL (ref 0.35–3.74)

## 2011-10-28 ENCOUNTER — Encounter

## 2011-12-16 ENCOUNTER — Encounter

## 2011-12-16 LAB — BUN: BUN: 10 MG/DL (ref 7–18)

## 2011-12-16 LAB — CREATININE: Creatinine: 0.6 MG/DL (ref 0.60–1.30)

## 2011-12-28 NOTE — Progress Notes (Signed)
SCREENING MAMMOGRAM COMPLETED.

## 2012-12-01 ENCOUNTER — Encounter

## 2012-12-02 LAB — METABOLIC PANEL, COMPREHENSIVE
A-G Ratio: 1.3 (ref 1.2–2.2)
ALT (SGPT): 25 U/L (ref 12–78)
AST (SGOT): 19 U/L (ref 15–37)
Albumin: 4.3 g/dL (ref 3.4–5.0)
Alk. phosphatase: 71 U/L (ref 50–136)
Anion gap: 11 mmol/L (ref 6–15)
BUN/Creatinine ratio: 22 (ref 7–25)
BUN: 11 MG/DL (ref 7–18)
Bilirubin, total: 0.6 MG/DL (ref <1.1)
CO2: 25 mmol/L (ref 21–32)
Calcium: 9.5 MG/DL (ref 8.5–10.1)
Chloride: 105 mmol/L (ref 98–107)
Creatinine: 0.5 MG/DL — ABNORMAL LOW (ref 0.60–1.30)
GFR est AA: >60 mL/min/{1.73_m2} (ref >60)
GFR est non-AA: >60 mL/min/{1.73_m2} (ref >60)
Globulin: 3.2 g/dL (ref 2.4–3.5)
Glucose: 92 mg/dL (ref 70–110)
Potassium: 4.4 mmol/L (ref 3.5–5.3)
Protein, total: 7.5 g/dL (ref 6.4–8.2)
Sodium: 141 mmol/L (ref 136–145)

## 2012-12-28 ENCOUNTER — Encounter

## 2012-12-28 NOTE — Progress Notes (Signed)
Screening Mammogram completed at 3:43 PM, with no complications.    LORI A WOOLLEY, RT

## 2013-01-27 NOTE — Telephone Encounter (Signed)
OK to go ahead and reorder for patient.

## 2013-03-08 NOTE — Progress Notes (Signed)
[facility]  [address], [place], [place] [address]  Phone:  [phone]  Fax:  [phone]  Dimas Aguas L. Cheri Guppy, D.O., F.A.C.O.I., F.A.C.R.  Pamalee Leyden, MSN, APRN, FNP-BC  Curlene Dolphin, PA-C      Patient Name:  Rachael Brown.O.B:  05-29-1961    Date of Service:  03/08/2013   PCP:  Joretta Bachelor, MD    Subjective:  The patient is being seen in the office today for a scheduled follow up visit.  She reports in the interim since her last visit she has actually done quite well.  She reports that the carpal tunnel syndrome symptoms have largely disappeared in the left arm.  She reports she still has some intermittent symptoms, especially at night in the right arm.  Symptoms seem to have reappeared intermittently about 4 weeks ago after the patient had a fall and caught herself while she was falling.  She denies any injury and reports that the wrist was checked for a fracture.  She reports she continues to have some low back pain that does radiate down her legs, especially the right and that sometimes this makes it difficult to sleep on her right hip at night.  She also reports some pain at the right iliac crest at a previous surgical site, but reports overall she is doing quite well, has been very active, and has been exercising without difficulty.  She denies any new joint pain or swelling.  She denies GI upset or side effects from medications.  Her morning stiffness will last 20 minutes.  She denies any new neurologic complaints or skin changes.          Physical examination:  BP 122/68   Pulse 86   Resp 16   Ht 5\' 2"  (1.575 m)   Wt 164 lb (74.39 kg)   BMI 29.99 kg/m2  Heart regular rate and rhythm with no rubs.    Lungs clear to auscultation with no rubs.    Joint -- There is no warmth or erythema.  There is no active synovitis. Joint range of motion has remained stable. Bony deformities have remained stable.     Spine: Cervical thoracic and lumbar spine have good functional ROM with no     deformity.  There is no scoliosis.  There is no change in the lordotic and kyphotic    curvatures.     Shoulder: Shoulders have normal active and passive ROM. There is no pain on    ROM testing.  There are no effusions or synovitis.  There is no warmth or   erythema.   Elbow: Elbow ROM is WNL.  There is no active synovitis, effusion, warmth, or   erythema.     Wrist: Negative Phalen's sign bilaterally.  Positive Tinel's right wrist, but negative on left.  Wrists have normal flexion and extension with no  active synovitis or effusion.    Finger: MCP, PIP, DIP joints are WNL.  First CMC joint is WNL.  There is no    active synovitis or effusion of the finger joints.   Hip: Pain referred to low back with hip movement.  Hips have normal ROM.  There is no pain on palpation or ROM.     Knees: Knee flexion and extension is WNL.  There are no palpable deformities.    There are no effusions or active synovitis  There is no warmth or erythema.   Ankle: Ankles have normal ROM with no effusion or  synovitis.    Toe: MTP and distal toe joints are WNL.  There is no active synovitis, warmth,    erythema  or effusion.         Impressions:  Encounter Diagnoses     ICD-9-CM   1. OA (osteoarthritis) 715.90   2. DDD (degenerative disc disease) 722.6   3. Carpal tunnel syndrome, left 354.0   4. Carpal tunnel syndrome, right 354.0         Plan:  1. Continue current medications.    2. Add low dose of gabapentin as ordered.  Will increase dosage if no side effects are noted.   3. Will consider repeat injection for Carpal Tunnel Syndrome if symptoms persist.   4. Treatment alternatives were discussed.  5. Reviewed results of DEXA scan with the patient.   6. Lab for monitoring of disease and treatment  7. Prior lab were reviewed and reviewed with the patient  8. Current home physical therapy exercise program was reviewed.  9. Ms. Platter was given detailed instructions in a home physical therapy exercise program.  10. Proper dosing of  calcium and vitamin D were reviewed.  11. Home safety precautions and activity restrictions were discussed.  Orders Placed This Encounter   ??? METABOLIC PANEL, COMPREHENSIVE     Standing Status: Future      Number of Occurrences:       Standing Expiration Date: 09/07/2013   ??? POC COMPLETE CBC, AUTOMATED ENTER ([id number])   ??? gabapentin (NEURONTIN) 100 mg capsule     Sig: Take 1 Cap by mouth three (3) times daily.     Dispense:  90 Cap     Refill:  3   ??? tiZANidine (ZANAFLEX) 2 mg capsule     Sig: One tablet twice daily and Two tablets at bedtime.     Dispense:  360 Cap     Refill:  3     Follow-up Disposition:  Return in about 3 months (around 06/08/2013), or if symptoms worsen or fail to improve, for worsening carpal tunnel syndrome.      Mendel Ryder, NP

## 2013-03-08 NOTE — Patient Instructions (Addendum)
MyChart Activation    Thank you for requesting access to MyChart. Please follow the instructions below to securely access and download your online medical record. MyChart allows you to send messages to your doctor, view your test results, renew your prescriptions, schedule appointments, and more.    How Do I Sign Up?    1. In your internet browser, go to [email or web address].  2. Click on the First Time User? Click Here link in the Sign In box. You will see the New Member Sign Up page.  3. Enter your MyChart Access Code exactly as it appears below. You will not need to use this code after you???ve completed the sign-up process. If you do not sign up before the expiration date, you must request a new code.    MyChart Access Code: [email or web address]  Expires: 06/06/2013  3:24 PM (This is the date your MyChart access code will expire)    4. Enter the last four digits of your Social Security Number (xxxx) and Date of Birth (mm/dd/yyyy) as indicated and click Submit. You will be taken to the next sign-up page.  5. Create a MyChart ID. This will be your MyChart login ID and cannot be changed, so think of one that is secure and easy to remember.  6. Create a MyChart password. You can change your password at any time.  7. Enter your Password Reset Question and Answer. This can be used at a later time if you forget your password.   8. Enter your e-mail address. You will receive e-mail notification when new information is available in MyChart.  9. Click Sign Up. You can now view and download portions of your medical record.  10. Click the Download Summary menu link to download a portable copy of your medical information.    Additional Information    If you have questions, please visit the Frequently Asked Questions section of the MyChart website at [email or web address] Remember, MyChart is NOT to be used for urgent needs. For medical emergencies, dial 911.     Osteoarthritis   Osteoarthritis is a joint disease that most often affects middle-age to elderly people. It is commonly referred to as OA or as "wear and tear" of the joints, but we now know that OA is a disease of the entire joint, involving the cartilage, joint lining, ligaments, and bone. Although it is more common in older people, it is not really accurate to say that the joints are just "wearing out."  About 27 million Americans are living with OA, the most common form of joint disease. The lifetime risk of developing OA of the knee is about 46%, and the lifetime risk of developing OA of the hip is 25%, according to the [facility], a long-term study from the [employer] [facility] [place] and sponsored by [employer] for [employer] and Prevention (often called the [employer]) and the [employer].  OA is a top cause of disability in older people. The goal of treatment in OA is to reduce pain and improve function. There is no cure for the disease, but some treatments attempt to slow disease progression.  Fast facts  OA is the most common form of joint disease, and is a leading cause of disability in elderly people.   This arthritis tends to occur in the hand joints, spine, hips, knees, and great toes.   It is characterized by breakdown of the cartilage (the tissue that cushions the ends of the bones between joints), bony  changes of the joints, deterioration of tendons and ligaments, and various degrees of inflammation of the synovium (joint lining).   Though some of the joint changes are irreversible, most patients will not need joint replacement surgery.   OA symptoms (what you feel) can vary greatly among patients.   A rheumatologist can detect arthritis and prescribe the proper treatment.  What is osteoarthritis?  OA is a frequently slowly progressive joint disease typically seen in middle-aged to elderly people.  The disease occurs when the joint cartilage breaks down often because  of mechanical stress or biochemical alterations, causing the bone underneath to fail. OA can occur together with other types of arthritis, such as gout or rheumatoid arthritis.      OA tends to affect commonly used joints such as the hands and spine, and the weight-bearing joints such as the hips and knees.   Symptoms include:   Joint pain and stiffness   Knobby swelling at the joint   Cracking or grinding noise with joint movement   Decreased function of the joint  Who gets osteoarthritis?  OA affects people of all races and both sexes. Most often, it occurs in  patients age 62 and above. However, it can occur sooner if you have  other risk factors (things that raise the risk of getting OA).   Risk factors include:   Older age   Having family members with OA   Obesity   Joint injury or repetitive use (overuse) of joints   Joint deformity such as unequal leg length, bowlegs or knocked knees  How is osteoarthritis diagnosed?  Most often doctors detect OA based on the typical symptoms (described earlier) and on results of the physical exam. In some cases, X-rays or other imaging tests may be useful to tell the extent of disease or to help rule out other joint problems.   How is osteoarthritis treated?   There is no proven treatment yet that can reverse joint damage from OA. The goal of treatment is to reduce pain and improve function of the affected joints. Most often, this is possible with a mixture of physical measures and drug therapy and, sometimes, surgery.     Physical measures ??? Weight loss and exercise are useful in OA. Excess weight puts stress on your knee joints and hips and low back. For every 10 pounds of weight you lose over 10 years, you can reduce the chance of developing knee OA by up to 50%. Exercise can improve your muscle strength, decrease joint pain and stiffness, and lower the chance of disability due to OA.    Also helpful are support ("assistive") devices, such as braces or a walking cane,  that help you do daily activities. Heat or cold therapy can help relieve OA symptoms for a short time.     Certain alternative treatments such as spa (hot tub), massage, acupuncture and chiropractic manipulation can help relieve pain for a short time. They can be costly, though, and require repeated treatments. Also, the long-term benefits of these alternative (sometimes called complementary or integrative) medicine treatments are unproven but are under study.     Drug Therapy ??? Forms of drug therapy include topical, oral (by mouth) and injections (shots). You apply topical drugs directly on the skin over the affected joints. These medicines include capsaicin cream, lidocaine and diclofenac gel. Oral pain relievers such as acetaminophen are common first treatments. So are nonsteroidal anti-inflammatory drugs (often called NSAIDs), which decrease swelling and pain.  In 2010, the government (FDA) approved the use of duloxetine (Cymbalta) for chronic (long-term) musculoskeletal pain including from OA. This oral drug is not new. It also is in use for other health concerns, such as mood disorders, nerve pain and fibromyalgia.     Patients with more serious pain may need stronger medications, such as prescription narcotics.     Joint injections with corticosteroids (sometimes called cortisone shots) or with a form of lubricant called hyaluronic acid can give months of pain relief from OA. This lubricant is given in the knee, and these shots may help delay the need for a knee replacement by a few years in some patients.     Surgery ??? Surgical treatment becomes an option for severe cases. This includes when the joint has serious damage, or when medical treatment fails to relieve pain and you have major loss of function. Surgery may involve arthroscopy, repair of the joint done through small incisions (cuts). If the joint damage cannot be repaired, you may need a joint replacement.    Supplements ??? Many over-the-counter  nutrition supplements have been used for treatment of OA. Most lack good research data to support their effectiveness and safety. Among the most widely used are glucosamine/chondroitin sulfate, calcium and vitamin D, and omega-3 fatty acids. To ensure safety and avoid drug interactions, consult your doctor or pharmacist before using any of these supplements. This is especially true when you are combining these supplements with prescribed drugs.  Living with Osteoarthritis  There is no cure for OA, but you can manage how it affects your lifestyle. Some tips include:  Properly position and support your neck and back while sitting or sleeping.   Adjust furniture, such as raising a chair or toilet seat.   Avoid repeated motions of the joint, especially frequent bending.   Lose weight if you are overweight or obese, which can reduce pain and slow progression of OA.   Exercise each day.   Use arthritis support devices that will help you do daily activities.  You might want to work with a physical therapist or occupational therapist to learn the best exercises and to choose arthritis assistive devices.   Points to remember  OA is the most common form of arthritis and can occur together with other types of arthritis.   The goal of treatment in OA is to reduce pain and improve function.   Exercise is an important part of OA treatment because it can decrease joint pain and improve function.   At present, there is no treatment that can reverse the damage of OA in the joints. Researchers are trying to find ways to slow or reverse this joint damage.   The rheumatologist's role in the treatment of osteoarthritis  Rheumatologists are doctors who are experts in diagnosing and treating arthritis and other diseases of the joints, muscles and bones. You may also need to see other health care providers, for instance, physical or occupational therapists and orthopedic doctors.    Updated February 2013   Written by Manning Charity, MD, and reviewed by the [facility] of Rheumatology Communications and Marketing Committee.   This patient fact sheet is provided for general education only. Individuals should consult a qualified health care provider for professional medical advice, diagnosis and treatment of a medical or health condition.   ?? 2013 [employer] of Rheumatology     Basic Conditioning Exercises For The Back    1. Pelvic Tilt: Take a deep breath; exhale slowly. Keeping  the legs bent, tighten your abdominal and buttock muscles and push the small of your back flat into the floor. Hold for a count of 10, and repeat slowly 5 times.       2. Single Leg Flex:  With both hands bring one knee at a time up as near to your chest as possible, keeping the other leg bent. Hold for a count of 10 and repeat 5 times with each leg.      3.  Both Leg Flex:  Tighten your abdominal muscles and bring both knees up to your chest.  Hold for a count of 10, and repeat slowly 5 times.      4.  Partial Sit-Up:  Lie with both hands across your chest and both legs bent.  Curl up with your head flexed, so your chin is close to your chest until your shoulder blades are off the floor.  Hold for a count of 10.  Slowly lower your shoulders onto the floor, keeping your head flexed. Repeat 5 times slowly before relaxing your head.                          5.  Hip Rotation: Lie on your back with hands under the back of your neck.  Keeping one leg straight, bend the other leg and lift the foot over the straight leg and place on the floor next to the knee.  Keeping the shoulders in place, lift the buttock of the bent leg and rotate the hips so the bent knee approaches the floor the other side of the straight leg. Hold for a count of 5, and repeat 5 times with each leg.      6. Prone Relax:  Lie face down with head turned to one side and arms at your sides. Try to relax into the position and feel the release of tension in the muscles of your back.   Hold the position for 5 minutes.    Cervical Isometric Exercises    These must be done standing or sitting in front of a mirror to make sure the head is maintained in an upright position.  The purpose of these exercises is to form a "natural splint" to strengthen and support the neck.    -Place your right palm against the right side of your head. Press your hand against your head and your head against your hand with equal resistance for five (5) seconds.    -Place your left palm against the left side of your head. Press your hand against your head and your head against your hand with equal resistance for five(5) seconds.    -Clasp your hands behind your head. Press your head back against your hands and push your hands forward against your head with equal resistance for five(5) seconds.    -Place your fists together in front of your forehead. Press your fists back against your head and your head forward against your fists with equal resistance for five(5) seconds.        Cervical Range of Motion Exercises    -Turn your chin to your right shoulder then turn it to your left shoulder.    -Bend your neck to bring your ear as close as you can to your right shoulder, then to your left shoulder.    -Bring your head down to bring your chin as close to your chest as you can, then tilt your head back as far as  you can.    Quad Set    Purpose: To strengthen your thigh muscles (quadricepts).    Position: Half sit with your weak leg as straight as possible. Bend the other leg as illustrated.    Action:Tighten (flex) the muscles on the top of your thigh. This will make your knee cap move toward your hip. Your leg should still be straight and lying on the bed. Hold for five seconds. Relax. Repeat    Straight Leg Raise    Purpose: To strengthen your thigh muscles (quadricepts).    Position: Lie on your back with your weak leg as straight as possible. Bend your other leg as illustrated to protect your back.    Action: Tighten your  thigh muscle. Raise your leg as to an angle similar to what is illustrated while keeping your leg muscles tight and as straight as possible. Hold for three(3) seconds. Lower your leg slowly back to the ground. Relax your leg muscles. Repeat    NSAIDs: Nonsteroidal Anti-inflammatory Drugs  Description  Millions of Americans have arthritis and other painful health problems affecting the musculoskeletal system: the joints, muscles and bones. A mainstay of treatment for these conditions for more than three decades has been a class of medications known as nonsteroidal anti-inflammatory drugs or NSAIDs.  Traditional NSAIDs include aspirin, ibuprofen (Advil, Motrin, etc.), naproxen (e.g., Aleve) and many other generic and brand name drugs. A newer NSAID is celecoxib (Celebrex), which doctors call a "COX-2 inhibitor" or a "COX-2 selective" NSAID.  FAST FACTS:  Nonsteroidal anti-inflammatory drugs are some of the most often used pain medicines in adults.   NSAIDs also can decrease inflammation, such as in arthritis.   Most people can take NSAIDs without any trouble. Yet side effects are possible, including stomach bleeding, allergic reactions, kidney problems and heart problems.   Though you can buy many NSAIDs without a doctor's prescription, not everyone should take these drugs. Always talk to your doctor before taking any new medicine.  USES  NSAIDs are used to relieve pain and reduce signs of inflammation: fever, swelling and redness. People may take NSAIDs for temporary conditions such as sprains, strains, flares of back pain, headache and painful menstrual periods. NSAIDs also are a common treatment for chronic (long-term) health problems such as arthritis (rheumatoid arthritis, osteoarthritis and others) and lupus.  Another use of aspirin is for prevention. A daily low-dose aspirin can help lower the chance of having a heart attack or stroke for people who are at high risk of these serious events.  HOW THEY WORK   NSAIDs work to decrease inflammation, pain and fever. NSAIDs block enzymes in the body that help make prostaglandins, chemicals that play a role in pain and inflammation. Older NSAIDs like ibuprofen block two of these enzymes, COX-1 and COX-2, but celecoxib (Celebrex) targets mainly COX-2. Celecoxib is no more effective than other NSAIDs. However, its possible risks differ from those of other NSAIDs, as the ???Side effects??? section below explains.  DOSING  Each NSAID has its own dose (strength) and interval for how often to take the drug. The doses of medicines that are over the counter???sold without a doctor's prescription???are often less than prescription doses of the same medicine. For instance, the most common doses of prescription ibuprofen are 400 mg (which stands for milligrams), 600 mg and 800 mg, but the highest dose sold over the counter is 200 mg.  Over-the-counter NSAIDs may be strong enough for temporary conditions such as sprains, strains, flares of  back pain, headache and menstrual pain. For chronic health problems such as arthritis or lupus, a rheumatologist will most often prescribe these drugs at higher doses and over a long time.  Ask your doctor to explain the goal of using an NSAID for your condition. If the goal of taking the NSAID is to suppress inflammation, you likely will need to take higher doses and at a set dosing interval. If it is mainly to relieve pain, you can often take lower doses on an as-needed basis.  TIME TO EFFECT  NSAIDs start to work quickly, most often in less than a few hours. How fast they take effect depends on the intended effect. Pain control tends to occur much quicker than anti-inflammatory (inflammation-fighting) effects, such as for arthritis.   SIDE EFFECTS  All drugs have a risk of side effects, including NSAIDs. It is important to understand the risks and benefits of a drug before deciding to take it. Possible risks of all NSAIDs include, among others:  Stomach  problems like bleeding, ulcer and stomach upset   High blood pressure   Fluid retention (causing swelling, such as around the lower legs, feet, ankles and hands)   Kidney problems   Heart problems   Rashes  Many people may assume that over-the-counter medications are safer than the same or similar medicines that need a prescription, but this is not always true. Medications must be generally safe before the FDA will allow them on the market. For added safety, the doses of over-the-counter medicines are often less than prescription doses of the same medicine. In equal doses, the safety profile of over-the-counter NSAIDs is the same as for their prescription forms. The risk of side effects does go up with higher doses.  Though side effects can occur at any time you take an NSAID, the chance tends to be greater the longer you take the drug. If you must take NSAIDs for more than 30 days, ask your doctor how to lower your risk of problems. You may need to take extra precautions to avoid side effects, such as taking medicine to lower the risk of stomach bleeding.  There is no clear difference in overall safety among traditional NSAIDs on the market. Side effects of each drug may vary, and some patients may be more prone to the side effects of one drug than another. In some studies, naproxen appears safer on the heart. Celecoxib is somewhat less likely to cause stomach problems, such as ulcers and bleeding, than traditional NSAIDs. Yet some large studies in patients have linked celecoxib to a possible raised risk of heart problems, more so at doses above 200 mg per day.  Warnings: If you have heart disease, you should not take celecoxib or another NSAID, including an over-the-counter NSAID, without first discussing it with your doctor. Treatments such as acetaminophen (Tylenol) or other pain medicine may be better in this case. Some traditional NSAIDs may also interfere with aspirin prescribed to patients with heart  disease. As well, mixing aspirin with other NSAIDs raises the risk of stomach bleeding.  NSAIDS should not be used during pregnancy unless your doctor thinks they are necessary.  Some people have other risk factors that raise their chance of having health problems due to NSAIDs. Talk to your doctor before taking an NSAID if any of these risk factors apply to you:  Have any of these health problems   decreased kidney or liver function, or an uncontrolled liver problem (such as hepatitis)   ulcer,  gastritis (inflammation of the stomach lining) or stomach bleeding now or in the past   low platelet count   Crohn's disease or ulcerative colitis   asthma or chronic lung disease   reflux disease (also known as GERD), indigestion or hiatal hernia   high blood pressure, congestive heart failure or a past stroke or heart attack  Are allergic to aspirin, other NSAIDs, or sulfa drugs, or have nasal polyps (linked to a greater chance of NSAID allergy)   Take blood thinners or corticosteroids (see ???Drug interactions??? below)   Are pregnant, may become pregnant, or are breast-feeding   Drink more than seven alcoholic drinks per week or more than two in a day   Age over 27  Doctors have long warned not to give aspirin to children under age 40, but teens with a virus also should avoid drugs containing aspirin. There is a risk of Reye???s syndrome, a rare but deadly illness that can affect the brain and liver.  DRUG INTERACTIONS  Tell your doctor about all of the medications you are taking, even ones that are over the counter or natural remedies. Some medicines increase the risk of side effects of NSAIDs. These include aspirin and other blood thinners like warfarin (Coumadin), heparin and clopidogrel (Plavix) as well as corticosteroids, such as prednisone. If you take any of these medicines, ask your doctor if you can take NSAIDs.  Do not mix an over-the-counter NSAID with a prescribed NSAID, or take more than the recommended dose of  the NSAID. Doing so could increase your chance of side effects.  INFORMATION TO DISCUSS WITH YOUR PRIMARY CARE PHYSICIAN AND OTHER SPECIALISTS  Tell your other doctors that you are taking an NSAID. This will help avoid drug interactions.  To help decide the safest and best possible treatment for your arthritis or other health problem, your rheumatologist will want to know if you have, or in the past had, any of the risk factors listed above that raise the chance of having side effects due to NSAIDs. Women should mention if they are pregnant, as NSAIDs may be risky during pregnancy. Besides a complete health history, your doctor will want a list of all the medications you are taking or have taken recently, including store-bought ones.  Though very effective for relieving pain and inflammation, NSAIDs are not the best choice for all people. The choice of an NSAID or other drug will depend on many factors. If NSAIDs are not right for you, there are many other drug choices your rheumatologist may suggest for you. Besides medicines, other treatments sometimes can help decrease pain. These include corticosteroid injections (shots) into the affected site, physical therapy, use of heat or cold, massage and relaxation therapies, and acupuncture.  For most people with arthritis, being physically active, eating right and keeping a healthy weight are good ways to ease arthritis pain. The fact sheets ???Exercise and Arthritis??? and ???Living Well with a Rheumatic Disease??? describe a healthy lifestyle.  POINTS TO REMEMBER  NSAIDs can be very effective medications for people with arthritis or other rheumatic diseases.   At low doses, NSAIDs work as painkillers. To reduce inflammation, higher doses are needed.   No one NSAID is better or safer overall than any other NSAID.   Ask your physician whether you can take NSAIDs if you are pregnant, taking other medicines, over age 34 or under a doctor???s care for a health problem.   Do not take  an over-the-counter NSAID if you are taking  a prescription NSAID.   Tell your doctor if you have any problems taking NSAIDs.  FOR MORE INFORMATION  The [employer] of Rheumatology has compiled this list to give you a starting point for your own additional research. The [employer] does not endorse or maintain these Web sites, and is not responsible for any information or claims provided on them. It is always best to talk with your rheumatologist for more information and before making any decisions about your care.  Arthritis Danaher Corporation.arthritis.org  Food and Drug Administration: Information for Consumers  http://www.gilbert-cooper.com/  MedlinePlus  GamblingJourney.uy.html  Rheumatology Research Foundation  Learn how the Rheumatology Research Foundation advances research and training to improve the health of people with rheumatic diseases.  www.rheumatology.org/REF  Updated August 2012  Written by the [employer] and [employer].  This patient fact sheet is provided for general education only. Individuals should consult a qualified health care provider for professional medical advice, diagnosis and treatment of a medical or health condition.  ?? 2012 [employer]

## 2013-03-09 LAB — METABOLIC PANEL, COMPREHENSIVE
A-G Ratio: 1.2 (ref 1.2–2.2)
ALT (SGPT): 34 U/L (ref 12–78)
AST (SGOT): 25 U/L (ref 15–37)
Albumin: 3.8 g/dL (ref 3.4–5.0)
Alk. phosphatase: 73 U/L (ref 50–136)
Anion gap: 7 mmol/L (ref 6–15)
BUN/Creatinine ratio: 23 (ref 7–25)
BUN: 14 MG/DL (ref 7–18)
Bilirubin, total: 0.4 MG/DL (ref <1.1)
CO2: 28 mmol/L (ref 21–32)
Calcium: 9.1 MG/DL (ref 8.5–10.1)
Chloride: 104 mmol/L (ref 98–107)
Creatinine: 0.6 MG/DL (ref 0.60–1.30)
GFR est AA: >60 mL/min/{1.73_m2} (ref >60)
GFR est non-AA: >60 mL/min/{1.73_m2} (ref >60)
Globulin: 3.2 g/dL (ref 2.4–3.5)
Glucose: 99 mg/dL (ref 70–110)
Potassium: 3.6 mmol/L (ref 3.5–5.3)
Protein, total: 7 g/dL (ref 6.4–8.2)
Sodium: 139 mmol/L (ref 136–145)

## 2013-03-24 NOTE — Telephone Encounter (Signed)
Per Darl Pikes, pt can lower Gabapentin to BID x1wk, call w/report. If no help, will lower to QD. Pt notified.

## 2013-04-10 NOTE — Progress Notes (Signed)
[facility]  [address] [place], [place] [address]  Phone:  [phone]  Fax:  [phone]  Dimas Aguas L. Cheri Guppy, D.O., F.A.C.O.I., F.A.C.R.  Pamalee Leyden, MSN, APRN, FNP-BC  Curlene Dolphin, PA-C      Patient Name:  Rachael Brown.O.B:  06-May-1961    Date of Service:  04/10/2013   PCP:  Joretta Bachelor, MD    Carpal Tunnel Injection  Right carpal Tunnel Injection site were identified.  Injection site was swabbed with alcohol.  Local anesthesia was obtained with ethyl chloride spray.  Carpal tunnel was injected with 20mg  Depomedrol.  Hemostasis was obtained with pressure.  A bandage was applied.  Post injection care was discussed with the patient.  Post injection activity was discussed with the patient.  Use of cock up wrist splints was reviewed with the patient.      Mendel Ryder, NP

## 2013-04-10 NOTE — Patient Instructions (Addendum)
MyChart Activation    Thank you for requesting access to MyChart. Please follow the instructions below to securely access and download your online medical record. MyChart allows you to send messages to your doctor, view your test results, renew your prescriptions, schedule appointments, and more.    How Do I Sign Up?    1. In your internet browser, go to [email or web address].  2. Click on the First Time User? Click Here link in the Sign In box. You will see the New Member Sign Up page.  3. Enter your MyChart Access Code exactly as it appears below. You will not need to use this code after you???ve completed the sign-up process. If you do not sign up before the expiration date, you must request a new code.    MyChart Access Code: Not generated  Current MyChart Status: Active (This is the date your MyChart access code will expire)    4. Enter the last four digits of your Social Security Number (xxxx) and Date of Birth (mm/dd/yyyy) as indicated and click Submit. You will be taken to the next sign-up page.  5. Create a MyChart ID. This will be your MyChart login ID and cannot be changed, so think of one that is secure and easy to remember.  6. Create a MyChart password. You can change your password at any time.  7. Enter your Password Reset Question and Answer. This can be used at a later time if you forget your password.   8. Enter your e-mail address. You will receive e-mail notification when new information is available in MyChart.  9. Click Sign Up. You can now view and download portions of your medical record.  10. Click the Download Summary menu link to download a portable copy of your medical information.    Additional Information    If you have questions, please visit the Frequently Asked Questions section of the MyChart website at [email or web address] Remember, MyChart is NOT to be used for urgent needs. For medical emergencies, dial 911.     Carpal Tunnel Syndrome   Carpal tunnel syndrome is a term that is well known. Unfortunately, given this widespread familiarity, people often attribute any discomfort or pain in the hand or wrist to carpal tunnel syndrome. Carpal tunnel syndrome is quite common, affecting 4-10 million Americans, and usually very treatable. However, there are many other conditions which can cause similar complaints. It is important to know the difference.  Fast facts  The main symptom of carpal tunnel syndrome is numbness of the fingers.   Carpal tunnel syndrome may interfere with hand strength and sensation, and cause a decrease in hand function.   Carpal tunnel syndrome can be treated effectively with medications, splinting, steroid injections in the wrist and/or surgery.  What is carpal tunnel syndrome?  Carpal tunnel syndrome is possibly the most common nerve disorder experienced today. The carpal tunnel is located at the wrist on the palm side of the hand just beneath the skin surface (palmar surface). Eight small wrist bones form three sides of the tunnel, giving rise to the name carpal tunnel. The remaining side of the tunnel, the palmar surface, is composed of soft tissues, consisting mainly of a ligament called the transverse carpal ligament. This ligament stretches over the top of the tunnel.  The median nerve and nine flexor tendons to the fingers pass through the carpal tunnel. [Flexor tendons help flex or bend the fingers.] When the median nerve in the wrist is compressed (squeezed by swollen tissues, for  example), it slows or blocks nerve impulses from travelling through the nerve. Because the median nerve provides muscle function and feeling in the hand, damaging the nerve results in symptoms ranging from mild occasional numbness to hand weakness, loss of feeling and loss of hand function.  Usually carpal tunnel syndrome affects only one hand, but can affect both at the same time, causing symptoms in the thumb and the index, middle and  adjacent half of the ring finger. In addition to numbness, those with the syndrome may experience tingling, pins and needle sensation or burning of the hand occasionally extending up to the forearm.   Frequently, symptoms surface in the morning upon awakening, or may cause waking during the night. Symptoms can occur with certain activities such as driving, holding a book or other repetitive activity with the hands, especially those requiring prolonged grasping or flexing (bending) of the wrist. Hand functional activities, such as buttoning, may become difficult, and sufferers may drop things more easily.  Individuals often shake their hands trying to obtain relief and may experience the sensation of swelling when, in fact, no swelling is present.   Because numbness and tingling may be mild and occur only periodically, many do not seek medical help. However, the disease can progress to more persistent numbness and burning. In some severe and chronic cases of carpal tunnel syndrome, loss of muscle mass occurs at the base of the thumb on the palm side of the hand. In these instances, especially in untreated cases, some weakness or impaired use of the hand as well as loss of sensation can occur with permanent nerve and muscle damage.   What causes carpal tunnel syndrome?  Carpal tunnel syndrome may occur in patients who are pregnant, overweight or have various medical conditions, including thyroid disease, diabetes or arthritis, or injuries such as wrist fractures. Whether repetitive work activities cause carpal tunnel syndrome is still debated, but it is thought that some repetitive hand activities, especially those involving vibratory motion, can worsen the symptoms. Just as frequently, the syndrome occurs on its own.  However, many other conditions also can be responsible for the symptoms of pain, swelling, numbness or weakness in the hands, including diseases of the nerves located anywhere from the neck to the  wrist. The pain and swelling in the hand joints and wrists caused by arthritis also can be responsible. For instance, pain at the base of the thumb commonly is caused by osteoarthritis. Tendonitis, an inflammation of the tendons that connect muscles to bones, can cause pain, swelling, and impaired use of the hand or wrist. Raynaud's phenomena can cause numbness and burning of the fingers as a result of cold exposure and sometimes due to autoimmune diseases. Raynaud's also causes fingers to have a whitish, bluish, or reddish color at various times; color changes are not seen in carpal tunnel syndrome.  Health care professionals should exclude these and other diseases before diagnosing carpal tunnel syndrome.   Who gets carpal tunnel syndrome?  Middle-aged to older individuals are more likely to develop the syndrome than younger persons, and females three times more frequently than males.     How is carpal tunnel syndrome diagnosed?  The diagnosis of carpal tunnel syndrome often is made by the physician based on an accurate description of the symptoms. During physical examination, testing may identify weakness of the muscles supplied by the median nerve in the hand, including some thumb muscles affected by the syndrome. There may be decreased sensation in the hand to  pin prick or light touch. Bending the wrist to 90 degrees for one minute may cause symptoms to appear in the hand (Phalen test) or tapping on the wrist with a reflex hammer may cause an electric shock-like sensation (Tinel Sign). Late in the disease, there may be thinning of the muscles or muscle atrophy at the base of the thumb.  Health care professionals can confirm the diagnosis of carpal tunnel syndrome and determine its severity with a two-part electrical test:   The nerve conduction test is the strongest evidence for carpal tunnel syndrome. A small electrode is placed on the skin on the elbow side of the tunnel, generating a mild electrical current.  The current stimulates the nerve. The impulse travels in the nerve through the tunnel to the hand where the impulse is measured. If there is damage to median nerve, the impulse will take longer than expected to get to the hand. The longer the delay in the nerve impulse, the worse the nerve damage will be.   The second part of the test, electromyography, measures the degree of abnormal function of the muscles. A small needle is placed in various muscles supplied by the median nerve, and the electrical impulse of the muscle is measured at rest and upon contraction (tightening with use) of the muscle. If the nerve is severely compressed, these muscles can be affected and will not perform normally on the electrical test.  In recent years, diagnostic ultrasonography and MRI have been used to help diagnose carpal tunnel syndrome and exclude other causes of hand and wrist symptoms. These technologies can identify swelling of the median nerve and abnormalities of the tunnel wall, its contents and surrounding area. This can include the source of median nerve compression including inflammation of structures in the tunnel such as inflamed tendons, which can occur in rheumatoid arthritis. Other tendon abnormalities, including a ganglion, or excessive fat in the tunnel, also can be seen using MRI.   How is carpal tunnel syndrome treated?  Medication such as acetaminophen and nonsteroidal anti-inflammatory drugs can be used for symptom relief. Splinting the wrist, especially at night, helps keep the wrist straight during the night and thus decreases the pressure on the median nerve. These splints, which are available in most drug stores, may relieve symptoms, especially in milder cases.  A cortisone injection into the carpal tunnel area often is helpful in relieving symptoms for weeks to months and can be repeated. If there is an underlying disease, such as hypothyroidism (under active thyroid) or rheumatoid arthritis, causing  the carpal tunnel syndrome, then treatment of the specific disease also may relieve symptoms.  When the above measures fail to relieve symptoms, surgical opening of the tunnel to relieve the pressure on the median nerve, known as a carpal tunnel release, is appropriate. In severe cases, physicians may consider early surgery. The surgery may be an open surgical procedure or an endoscopic procedure, and often can be done on an outpatient basis.  Points to remember  Other conditions, such as arthritis, tendonitis and other nerve involvement, need to be ruled out before diagnosing carpal tunnel syndrome.   Physicians can diagnose carpal tunnel syndrome by history of the symptoms, physical examination and electrical testing, and in some cases by use of ultrasound or MRI.   The syndrome is treated with analgesics (pain medicines), anti-inflammatory medications, splinting and cortisone injections. However, any underlying disease causing or contributing to the carpal tunnel syndrome, if present, also should be treated.  Updated September 2013  Written by Amanda Cockayne, MD and Kizzie Furnish. Lamar Benes, MD, and reviewed by the [employer] and Marketing Committee.  This patient fact sheet is provided for general education only. Individuals should consult a qualified health care provider for professional medical advice, diagnosis and treatment of a medical or health condition.  ?? 2013 [employer]

## 2013-04-12 NOTE — Progress Notes (Signed)
Pt seen for DEXA w/appt.

## 2013-06-29 NOTE — Progress Notes (Signed)
[facility]  [address], [place], [place] [address]  Phone:  [phone]  Fax:  [phone]  Dimas Aguas L. Cheri Guppy, D.O., F.A.C.O.I., F.A.C.R.  Pamalee Leyden, MSN, APRN, FNP-BC  Curlene Dolphin, PA-C      Patient Name:  Rachael Brown.O.B:  1960-11-02    Date of Service:  06/29/2013   PCP:  Joretta Bachelor, MD    Subjective:    Patient seen with no new complaints.  Denies new joint pain or swelling.  Denies GI upset or side effects from medications.  Morning stiffness is lasting  30  minutes. Doing back/quad exercise. Walking, light weights for aerobic exercise. Can do ADL's with no problems. No new neurologic complaints.  Denies new skin changes.   Denies new medications or changes since last visit. Back hurting if stands too long in one place. Resting, changing position helps.  Carpal Tunnel Syndrome-Wearing braces. Doing well presently. No new symptoms.    Physical examination:  BP 122/83   Pulse 75   Resp 16   Ht 5\' 2"  (1.575 m)   Wt 164 lb (74.39 kg)   BMI 29.99 kg/m2  Heart regular rate and rhythm with no rubs.    Lungs clear to auscultation with no rubs.    Joint -- There is no warmth or erythema.  There is no active synovitis. Joint range of  motion has remained stable. Bony deformities have remained stable.     Spine: Cervical thoracic and lumbar spine have good functional ROM with no    deformity.  There is no scoliosis.  There is no change in the lordotic and kyphotic    curvatures.     Shoulder: Shoulders have normal active and passive ROM. There is no pain on    ROM testing.  There are no effusions or synovitis.  There is no warmth or   erythema.   Elbow: Elbow ROM is WNL.  There is no active synovitis, effusion, warmth, or   erythema.     Wrist: Wrists have normal flexion and extension with no active synovitis or    effusion.    Finger: MCP, PIP, DIP joints are WNL.  First CMC joint is WNL.  There is no    active synovitis or effusion of the finger joints.   Hip: Hips have normal ROM.   There is no pain on palpation or ROM.     Knees: Knee flexion and extension is WNL.  There are no palpable deformities.    There are no effusions or active synovitis  There is no warmth or erythema.   Ankle: Ankles have normal ROM with no effusion or synovitis.    Toe: MTP and distal toe joints are WNL.  There is no active synovitis, warmth,    erythema  or effusion.         Impressions:  Encounter Diagnoses     ICD-9-CM   1. OA (osteoarthritis) 715.90   2. DDD (degenerative disc disease) 722.6   3. Carpal tunnel syndrome, right 354.0   4. Carpal tunnel syndrome, left 354.0         Plan:  1. Trial of Lidoderm patch for lumbar back pain. Call in 2 weeks with OA pain report.  2. Treatment alternatives were discussed.  3. Prior lab were reviewed and reviewed with the patient  4. Current home physical therapy exercise program was reviewed.  5. Continue back/quad exercise.  6. Aerobic exercise to 3 times week at 20-30 minutes.  7. Call office if any new symptoms.  Orders Placed This Encounter   ??? ketoprofen (ORUDIS) 75 mg capsule     Sig: Take one pill PO BID with food.     Dispense:  180 capsule     Refill:  1   ??? lidocaine (LIDODERM) 5 %(700 mg/patch)     Sig: Apply patch to the affected area for 12 hours a day and remove for 12 hours a day.     Dispense:  3 Package     Refill:  1     Follow-up Disposition: Not on File      Lilia Argue, [place]

## 2013-08-23 NOTE — Progress Notes (Signed)
[facility]  [address] [place], [place] [address]  Phone:  [phone]  Fax:  [phone]  Dimas Aguas L. Cheri Guppy, D.O., F.A.C.O.I., F.A.C.R.  Pamalee Leyden, MSN, APRN, FNP-BC  Curlene Dolphin, PA-C      Patient Name:  Rachael Brown.O.B:  Dec 04, 1960    Date of Service:  08/23/2013   PCP:  Joretta Bachelor, MD    Subjective:   Patient seen with complaint of continued numbness and tingling in her fingers.   She reports that the injection for carpal tunnel injection did not help for as long as previous injections had.  Additionally she reports she has had increased stiffness in her neck and spine, and increased pain down both arms.   She describes the pain down both arms as very similar to what she experienced prior to her C-Spine surgery.    She also reports "pins and needles" type sensation in both feet.   Upon further questioning she reports that the sensation in her hands and feet is similar and could be described as a tingling sensation.  Denies new joint swelling, but reports increased joint pain.  Denies GI upset or side effects from medications.  Morning stiffness is lasting  15  minutes.  No new neurologic complaints.  Denies new skin changes.   Denies new medications or changes since last visit.   She is especially concerned because she has a strong family history of rheumatoid arthritis.   Both brothers were affected in their 73's.      Physical examination:  BP 131/89   Pulse 94   Resp 16   Ht 5\' 2"  (1.575 m)   Wt 164 lb (74.39 kg)   BMI 29.99 kg/m2  Heart regular rate and rhythm with no rubs.    Lungs clear to auscultation with no rubs.    Joint -- There is no warmth or erythema.  There is no active synovitis. Joint range of motion has remained stable. Bony deformities have remained stable.     Spine: Cervical ROM is reduced when turning to the left and when moving the head back.  Thoracic and lumbar spine have good functional  ROM with no deformity.  There is no scoliosis.  There is no  change in the lordotic and kyphotic curvatures.     Shoulder: Shoulders have normal active and passive ROM. There is no pain on    ROM testing.  There are no effusions or synovitis.  There is no warmth or   erythema.   Elbow: Elbow ROM is WNL.  There is no active synovitis, effusion, warmth, or   erythema.  Tinel's sign positive on the right   Wrist: Wrists have normal flexion and extension with no active synovitis or    effusion.   Tinel's sign positive bilaterally.   Finger: MCP, PIP, DIP joints are unchanged.  First CMC joint is unchanged.  There is no    active synovitis or effusion of the finger joints.  Bony changes are stable.    Hip: Hips have normal ROM.  There is no pain on palpation or ROM.     Knees: Knee flexion and extension is unchanged.  There are no palpable deformities.    There are no effusions or active synovitis  There is no warmth or erythema.   Ankle: Ankles have normal ROM with no effusion or synovitis.    Toe: MTP and distal toe joints are unchanged.  There is no active synovitis, warmth,  erythema  or effusion.  Neurological:  examination is unchanged. Cranial nerves are intact. Motor and sensory   examinations are unchanged. Mini mental status examination is within normal limits. Reflexes are within normal limits. No other new neurologic changes with the exception of positive Tinel's sign at the wrists and right elbow           Impressions:  Encounter Diagnoses     ICD-9-CM   1. Neuropathy 355.9   2. OA (osteoarthritis) 715.90   3. DDD (degenerative disc disease) 722.6   4. Cervical radiculopathy due to degenerative joint disease of spine 723.4   5. Carpal tunnel syndrome, unspecified laterality 354.0         Plan:  1. X-rays as noted below to evaluate possible inflammatory arthritis as well as radiculopathy  2. Continue current medications  3. Treatment alternatives were discussed.  Will consider prednisone dose pack at future visit to help differentiate possible inflammatory arthritis  versus OA due to the patients concern  4. Lab for monitoring of disease and treatment, including labs to identify metabolic cause of neuropathy  5. Prior lab were reviewed and reviewed with the patient  6. Current home physical therapy exercise program was reviewed.  7. Continue with cock-up wrist supports.   8. Ms. Menzel was given detailed instructions in a home physical therapy exercise program.  9. Proper dosing of calcium and vitamin D were reviewed.  10. Home safety precautions and activity restrictions were discussed.  Orders Placed This Encounter   ??? XR SPINE CERV FLEX/EXT MAX 3 V     Standing Status: Future      Number of Occurrences:       Standing Expiration Date: 02/19/2014     Order Specific Question:  Reason for Exam     Answer:  cervical radiculopathy     Order Specific Question:  Is Patient Allergic to Contrast Dye?     Answer:  No   ??? XR SPINE LUMB 2 OR 3 V     Standing Status: Future      Number of Occurrences:       Standing Expiration Date: 09/22/2014     Order Specific Question:  Reason for Exam     Answer:  spondyloarthropathy     Order Specific Question:  Is Patient Allergic to Contrast Dye?     Answer:  No   ??? XR SI JTS MIN 3 V     Standing Status: Future      Number of Occurrences:       Standing Expiration Date: 09/22/2014     Order Specific Question:  Reason for Exam     Answer:  spondyloarthropathy     Order Specific Question:  Is Patient Allergic to Contrast Dye?     Answer:  No   ??? VITAMIN B12     Standing Status: Future      Number of Occurrences:       Standing Expiration Date: 02/20/2014   ??? RBC, FOLATE     Standing Status: Future      Number of Occurrences:       Standing Expiration Date: 02/20/2014   ??? HEMOGLOBIN A1C     Standing Status: Future      Number of Occurrences:       Standing Expiration Date: 02/20/2014   ??? VITAMIN B1, PLASMA     Standing Status: Future      Number of Occurrences:       Standing Expiration Date:  02/20/2014   ??? CBC WITH AUTOMATED DIFF     Standing Status:  Future      Number of Occurrences:       Standing Expiration Date: 02/20/2014   ??? METABOLIC PANEL, COMPREHENSIVE     Standing Status: Future      Number of Occurrences:       Standing Expiration Date: 02/20/2014     Follow-up Disposition: Not on File      Mendel Ryder, NP

## 2013-08-24 LAB — METABOLIC PANEL, COMPREHENSIVE
A-G Ratio: 1.2 (ref 1.2–2.2)
ALT (SGPT): 33 U/L (ref 12–78)
AST (SGOT): 23 U/L (ref 15–37)
Albumin: 4 g/dL (ref 3.4–5.0)
Alk. phosphatase: 79 U/L (ref 50–136)
Anion gap: 6 mmol/L (ref 6–15)
BUN/Creatinine ratio: 13 (ref 7–25)
BUN: 9 MG/DL (ref 7–18)
Bilirubin, total: 0.3 MG/DL (ref <1.1)
CO2: 27 mmol/L (ref 21–32)
Calcium: 9.4 MG/DL (ref 8.5–10.1)
Chloride: 105 mmol/L (ref 98–107)
Creatinine: 0.7 MG/DL (ref 0.60–1.30)
GFR est AA: >60 mL/min/{1.73_m2} (ref >60)
GFR est non-AA: >60 mL/min/{1.73_m2} (ref >60)
Globulin: 3.4 g/dL (ref 2.4–3.5)
Glucose: 116 mg/dL — ABNORMAL HIGH (ref 70–110)
Potassium: 4.1 mmol/L (ref 3.5–5.3)
Protein, total: 7.4 g/dL (ref 6.4–8.2)
Sodium: 138 mmol/L (ref 136–145)

## 2013-08-24 LAB — CBC WITH AUTOMATED DIFF
ABS. BASOPHILS: 0 10*3/uL (ref 0.0–0.1)
ABS. EOSINOPHILS: 0.2 10*3/uL (ref 0.0–0.5)
ABS. LYMPHOCYTES: 3.1 10*3/uL (ref 0.8–3.5)
ABS. MONOCYTES: 0.6 10*3/uL — ABNORMAL LOW (ref 0.8–3.5)
ABS. NEUTROPHILS: 3.8 10*3/uL (ref 1.5–8.0)
BASOPHILS: 0 % (ref 0–2)
EOSINOPHILS: 2 % (ref 0–5)
HCT: 39.8 % — ABNORMAL LOW (ref 41–53)
HGB: 13 g/dL (ref 12.0–16.0)
LYMPHOCYTES: 41 % (ref 19–48)
MCH: 28.3 PG (ref 27–31)
MCHC: 32.7 g/dL (ref 31–37)
MCV: 86.7 FL (ref 80–100)
MONOCYTES: 7 % (ref 3–9)
MPV: 10.5 FL — ABNORMAL HIGH (ref 5.9–10.3)
NEUTROPHILS: 50 % (ref 40–74)
PLATELET: 266 10*3/uL (ref 130–400)
RBC: 4.59 M/uL (ref 4.2–5.4)
RDW: 13.8 % (ref 11.5–14.5)
WBC: 7.7 10*3/uL (ref 4.5–10.8)

## 2013-08-24 LAB — HEMOGLOBIN A1C WITH EAG: Hemoglobin A1c: 5.6 % (ref 4.2–6.3)

## 2013-08-24 LAB — VITAMIN B12: Vitamin B12: 788 pg/mL (ref 254–1320)

## 2013-08-26 LAB — RBC, FOLATE
Folate, Hemolysate: 368.6 ng/mL
Folate, RBC: 915 ng/mL (ref 499–1504)
HCT: 40.3 % (ref 34.0–46.6)

## 2013-08-29 NOTE — Progress Notes (Signed)
Patient is aware    Rachael Brown

## 2013-08-30 NOTE — Telephone Encounter (Signed)
The patient requests pre-auth on the lidoderm patches.  Mendel Ryder, NP

## 2013-08-30 NOTE — Patient Instructions (Signed)
Carpal Tunnel Syndrome  Carpal tunnel syndrome is a term that is well known. Unfortunately, given this widespread familiarity, people often attribute any discomfort or pain in the hand or wrist to carpal tunnel syndrome. Carpal tunnel syndrome is quite common, affecting 4-10 million Americans, and usually very treatable. However, there are many other conditions which can cause similar complaints. It is important to know the difference.  Fast facts  The main symptom of carpal tunnel syndrome is numbness of the fingers.   Carpal tunnel syndrome may interfere with hand strength and sensation, and cause a decrease in hand function.   Carpal tunnel syndrome can be treated effectively with medications, splinting, steroid injections in the wrist and/or surgery.  What is carpal tunnel syndrome?  Carpal tunnel syndrome is possibly the most common nerve disorder experienced today. The carpal tunnel is located at the wrist on the palm side of the hand just beneath the skin surface (palmar surface). Eight small wrist bones form three sides of the tunnel, giving rise to the name carpal tunnel. The remaining side of the tunnel, the palmar surface, is composed of soft tissues, consisting mainly of a ligament called the transverse carpal ligament. This ligament stretches over the top of the tunnel.  The median nerve and nine flexor tendons to the fingers pass through the carpal tunnel. [Flexor tendons help flex or bend the fingers.] When the median nerve in the wrist is compressed (squeezed by swollen tissues, for example), it slows or blocks nerve impulses from travelling through the nerve. Because the median nerve provides muscle function and feeling in the hand, damaging the nerve results in symptoms ranging from mild occasional numbness to hand weakness, loss of feeling and loss of hand function.  Usually carpal tunnel syndrome affects only one hand, but can affect both at the same time, causing symptoms in the thumb and the  index, middle and adjacent half of the ring finger. In addition to numbness, those with the syndrome may experience tingling, pins and needle sensation or burning of the hand occasionally extending up to the forearm.   Frequently, symptoms surface in the morning upon awakening, or may cause waking during the night. Symptoms can occur with certain activities such as driving, holding a book or other repetitive activity with the hands, especially those requiring prolonged grasping or flexing (bending) of the wrist. Hand functional activities, such as buttoning, may become difficult, and sufferers may drop things more easily.  Individuals often shake their hands trying to obtain relief and may experience the sensation of swelling when, in fact, no swelling is present.   Because numbness and tingling may be mild and occur only periodically, many do not seek medical help. However, the disease can progress to more persistent numbness and burning. In some severe and chronic cases of carpal tunnel syndrome, loss of muscle mass occurs at the base of the thumb on the palm side of the hand. In these instances, especially in untreated cases, some weakness or impaired use of the hand as well as loss of sensation can occur with permanent nerve and muscle damage.   What causes carpal tunnel syndrome?  Carpal tunnel syndrome may occur in patients who are pregnant, overweight or have various medical conditions, including thyroid disease, diabetes or arthritis, or injuries such as wrist fractures. Whether repetitive work activities cause carpal tunnel syndrome is still debated, but it is thought that some repetitive hand activities, especially those involving vibratory motion, can worsen the symptoms. Just as frequently, the syndrome  occurs on its own.  However, many other conditions also can be responsible for the symptoms of pain, swelling, numbness or weakness in the hands, including diseases of the nerves located anywhere from the  neck to the wrist. The pain and swelling in the hand joints and wrists caused by arthritis also can be responsible. For instance, pain at the base of the thumb commonly is caused by osteoarthritis. Tendonitis, an inflammation of the tendons that connect muscles to bones, can cause pain, swelling, and impaired use of the hand or wrist. Raynaud's phenomena can cause numbness and burning of the fingers as a result of cold exposure and sometimes due to autoimmune diseases. Raynaud's also causes fingers to have a whitish, bluish, or reddish color at various times; color changes are not seen in carpal tunnel syndrome.  Health care professionals should exclude these and other diseases before diagnosing carpal tunnel syndrome.   Who gets carpal tunnel syndrome?  Middle-aged to older individuals are more likely to develop the syndrome than younger persons, and females three times more frequently than males.     How is carpal tunnel syndrome diagnosed?  The diagnosis of carpal tunnel syndrome often is made by the physician based on an accurate description of the symptoms. During physical examination, testing may identify weakness of the muscles supplied by the median nerve in the hand, including some thumb muscles affected by the syndrome. There may be decreased sensation in the hand to pin prick or light touch. Bending the wrist to 90 degrees for one minute may cause symptoms to appear in the hand (Phalen test) or tapping on the wrist with a reflex hammer may cause an electric shock-like sensation (Tinel Sign). Late in the disease, there may be thinning of the muscles or muscle atrophy at the base of the thumb.  Health care professionals can confirm the diagnosis of carpal tunnel syndrome and determine its severity with a two-part electrical test:   The nerve conduction test is the strongest evidence for carpal tunnel syndrome. A small electrode is placed on the skin on the elbow side of the tunnel, generating a mild  electrical current. The current stimulates the nerve. The impulse travels in the nerve through the tunnel to the hand where the impulse is measured. If there is damage to median nerve, the impulse will take longer than expected to get to the hand. The longer the delay in the nerve impulse, the worse the nerve damage will be.   The second part of the test, electromyography, measures the degree of abnormal function of the muscles. A small needle is placed in various muscles supplied by the median nerve, and the electrical impulse of the muscle is measured at rest and upon contraction (tightening with use) of the muscle. If the nerve is severely compressed, these muscles can be affected and will not perform normally on the electrical test.  In recent years, diagnostic ultrasonography and MRI have been used to help diagnose carpal tunnel syndrome and exclude other causes of hand and wrist symptoms. These technologies can identify swelling of the median nerve and abnormalities of the tunnel wall, its contents and surrounding area. This can include the source of median nerve compression including inflammation of structures in the tunnel such as inflamed tendons, which can occur in rheumatoid arthritis. Other tendon abnormalities, including a ganglion, or excessive fat in the tunnel, also can be seen using MRI.   How is carpal tunnel syndrome treated?  Medication such as acetaminophen and nonsteroidal anti-inflammatory   drugs can be used for symptom relief. Splinting the wrist, especially at night, helps keep the wrist straight during the night and thus decreases the pressure on the median nerve. These splints, which are available in most drug stores, may relieve symptoms, especially in milder cases.  A cortisone injection into the carpal tunnel area often is helpful in relieving symptoms for weeks to months and can be repeated. If there is an underlying disease, such as hypothyroidism (under active thyroid) or rheumatoid  arthritis, causing the carpal tunnel syndrome, then treatment of the specific disease also may relieve symptoms.  When the above measures fail to relieve symptoms, surgical opening of the tunnel to relieve the pressure on the median nerve, known as a carpal tunnel release, is appropriate. In severe cases, physicians may consider early surgery. The surgery may be an open surgical procedure or an endoscopic procedure, and often can be done on an outpatient basis.  Points to remember  Other conditions, such as arthritis, tendonitis and other nerve involvement, need to be ruled out before diagnosing carpal tunnel syndrome.   Physicians can diagnose carpal tunnel syndrome by history of the symptoms, physical examination and electrical testing, and in some cases by use of ultrasound or MRI.   The syndrome is treated with analgesics (pain medicines), anti-inflammatory medications, splinting and cortisone injections. However, any underlying disease causing or contributing to the carpal tunnel syndrome, if present, also should be treated.  Updated September 2013  Written by Amanda Cockayne, MD and Kizzie Furnish. Lamar Benes, MD, and reviewed by the [employer] Communications and Marketing Committee.  This patient fact sheet is provided for general education only. Individuals should consult a qualified health care provider for professional medical advice, diagnosis and treatment of a medical or health condition.  ?? 2013 [employer]      Carpal Tunnel Syndrome: Exercises  Your Care Instructions  Here are some examples of typical rehabilitation exercises for your condition. Start each exercise slowly. Ease off the exercise if you start to have pain.  Your doctor or physical therapist will tell you when you can start these exercises and which ones will work best for you.  How to do the exercises  Note: When you no longer have pain or numbness, you can do exercises to help prevent carpal tunnel  syndrome from coming back. Do not do any stretch or movement that is uncomfortable or painful.  Warm-up stretches   1. Rotate your wrist up, down, and from side to side. Repeat 4 times.  2. Stretch your fingers far apart. Relax them, and then stretch them again. Repeat 4 times.  3. Stretch your thumb by pulling it back gently, holding it, and then releasing it. Repeat 4 times.  Prayer stretch    1. Start with your palms together in front of your chest just below your chin.  2. Slowly lower your hands toward your waistline, keeping your hands close to your stomach and your palms together until you feel a mild to moderate stretch under your forearms.  3. Hold for at least 15 to 30 seconds. Repeat 2 to 4 times.  Wrist flexor stretch    1. Extend your arm in front of you with your palm up.  2. Bend your wrist, pointing your hand toward the floor.  3. With your other hand, gently bend your wrist farther until you feel a mild to moderate stretch in your forearm.  4. Hold for at least 15 to 30  seconds. Repeat 2 to 4 times.  Wrist extensor stretch    Repeat steps 1 through 4 of the stretch above, but begin with your extended hand palm down.  Follow-up care is a key part of your treatment and safety. Be sure to make and go to all appointments, and call your doctor if you are having problems. It's also a good idea to know your test results and keep a list of the medicines you take.   Where can you learn more?   Go to [email or web address]  Enter [id number] in the search box to learn more about "Carpal Tunnel Syndrome: Exercises."   ?? 2006-2014 [employer]. Care instructions adapted under license by [employer] (which disclaims liability or warranty for this information). This care instruction is for use with your licensed healthcare professional. If you have questions about a medical condition or this instruction, always ask your healthcare professional. [employer] disclaims any warranty or  liability for your use of this information.  Content Version: 10.2.346038; Current as of: March 08, 2013

## 2013-08-30 NOTE — Progress Notes (Signed)
[facility]  [address] [place], [place] [address]  Phone:  [phone]  Fax:  [phone]  Dimas Aguas L. Cheri Guppy, D.O., F.A.C.O.I., F.A.C.R.  Pamalee Leyden, MSN, APRN, FNP-BC  Curlene Dolphin, PA-C      Patient Name:  Rachael Brown.O.B:  04-26-1961    Date of Service:  08/30/2013   PCP:  Joretta Bachelor, MD    Rachael Brown  was seen for bilateral injections for Carpal Tunnel Syndrome.   The patient reports that in the past, these injections had helped tremendously, and that her symptoms have been bothersome despite wearing the cock up wrist supports.      Visit Vitals   Item Reading   ??? BP 113/81   ??? Pulse 92   ??? Resp 16   ??? Ht 5\' 2"  (1.575 m)   ??? Wt 164 lb (74.39 kg)   ??? BMI 29.99 kg/m2     Heart exam was unchanged.  Lungs - CTA.   Joint examination was unchanged.   There was positive Tinel's and Phalen's sign bilaterally.     Procedure was reviewed with the patient.  The patient was placed in the sitting position.  Land marks were identified, and the sites were prepped with alcohol. Local anesthetic was provided with Ethyl Chloride.  Bilateral Carpal Tunnel Injections were performed using 20 mg methylprednisolone.  Hemostasis was achieved and dry band aid was applied.     Post injection care was reviewed.  Patient was instructed to call with any problems or complications from the injection  Encouraged patient to wear Cock up wrist supports.   Reviewed recent lab results and reviewed with the patient.   Reviewed recent X-rays and reviewed with the patient.   (no signs of spondyloarthropathy, and SI joints were open.)  Follow-up visit was scheduled in 3 months  Home knee physical therapy exercise was reviewed with the patient.  Detailed activity and exercise instructions were given.  See AVS    Mendel Ryder, NP

## 2013-09-13 NOTE — Progress Notes (Signed)
[facility] [phone] spoke with Driscilla Moats states she is unable to start the prior authorization for the Lidocaine 5% Patch. Instructed to callback after the 1st of the year. Left message on patient voicemail to notify patient too early for authorization.

## 2013-09-18 NOTE — Telephone Encounter (Signed)
OK to fill this prescription.

## 2013-10-26 NOTE — Patient Instructions (Signed)
[facility]  Office working hours are Monday - Thursday 8:00 am to 6:00 pm.  Friday 8:00 am to 4:30 pm.  Office phone number is [phone] and fax is [phone].  For non-emergent medical care and clinical advice during office hours:   1. Call office or   2.  Send message or request using MyChart  For non-emergent medical care and clinical advice after office hours:  1.  Call [phone]  2.  Send message or request using MyChart  Emergency care can be obtained at the [facility] ER, Urgent Care or calling 911.    Patient Satisfaction Survey  We appreciate you giving us your e-mail address.  Please watch for our patient satisfaction survey which you will receive by e-mail.  We strive to provide you with the best care possible.  We respect all comments and will take comments into consideration to improve our service.  Thank you for your participation.  MyChart Activation    Thank you for requesting access to MyChart. Please follow the instructions below to securely access and download your online medical record. MyChart allows you to send messages to your doctor, view your test results, renew your prescriptions, schedule appointments, and more.    How Do I Sign Up?    1. In your internet browser, go to [email or web address]  2. Click on the First Time User? Click Here link in the Sign In box. You will be redirect to the New Member Sign Up page.  3. Enter your MyChart Access Code exactly as it appears below. You will not need to use this code after you???ve completed the sign-up process. If you do not sign up before the expiration date, you must request a new code.    MyChart Access Code: Not generated  Current MyChart Status: Active (This is the date your MyChart access code will expire)    4. Enter the last four digits of your Social Security Number (xxxx) and Date of Birth (mm/dd/yyyy) as indicated and click Submit. You will be taken to the next sign-up page.  5. Create a MyChart ID. This will be your  MyChart login ID and cannot be changed, so think of one that is secure and easy to remember.  6. Create a MyChart password. You can change your password at any time.  7. Enter your Password Reset Question and Answer. This can be used at a later time if you forget your password.   8. Enter your e-mail address. You will receive e-mail notification when new information is available in MyChart.  9. Click Sign Up. You can now view and download portions of your medical record.  10. Click the Download Summary menu link to download a portable copy of your medical information.    Additional Information    If you have questions, please visit the Frequently Asked Questions section of the MyChart website at [email or web address] Remember, MyChart is NOT to be used for urgent needs. For medical emergencies, dial 911.

## 2013-10-27 NOTE — Progress Notes (Signed)
[facility]         Phone:  [phone]         Fax:  [phone]    Patient:  Rachael EmorySharon J Muscarella DOB:  July 26, 1961  MRN:  [id number]  D.O.S.:  10/26/2013    Chief Complaint   Patient presents with   ??? Annual Exam     History of Present Illness:    Rachael Brown, a 53 y.o. female presents fro an annual, she is without complaint.    OB History    Grav Para Term Preterm Abortions TAB SAB Ect Mult Living                      Menstrual History  Comments: Post-menopausal    Contraception  Current form of contraception: None  Previous forms of contracpetion: None         Mammogram/Pap  Mammogram Date: 12/28/12  Mammogram Results: Negative  Pap Date: 09/15/12  Pap Results: negative    STD History  STD History: None    Allergies   Allergen Reactions   ??? Codeine Unknown (comments)   ??? Percocet [Oxycodone-Acetaminophen] Unknown (comments)     Outpatient Prescriptions Marked as Taking for the 10/26/13 encounter (Office Visit) with Verl DickerKurt Zakry Caso, MD   Medication Sig Dispense Refill   ??? diclofenac (VOLTAREN) 1 % topical gel Apply 4 g to affected area four (4) times daily.  100 g  1   ??? ketoprofen (ORUDIS) 75 mg capsule Take one pill PO BID with food.  180 capsule  1   ??? lidocaine (LIDODERM) 5 %(700 mg/patch) Apply patch to the affected area for 12 hours a day and remove for 12 hours a day.  3 Package  1   ??? tizanidine (ZANAFLEX) 4 mg tablet TAKE 1 TABLET THREE TIMES A DAY AND 1 TABLET AT BEDTIME  360 tablet  1   ??? fluticasone (FLONASE) 50 mcg/actuation nasal spray        ??? tiZANidine (ZANAFLEX) 2 mg capsule One tablet twice daily and Two tablets at bedtime.  360 Cap  3   ??? aspirin 81 mg tablet Take 81 mg by mouth.       ??? Dexlansoprazole (DEXILANT) 60 mg CpDM Take  by mouth.          Filed Vitals:    10/26/13 1502   BP: 108/78   Pulse: 72   Resp: 18   Height: 5\' 2"  (1.575 m)   Weight: 165 lb (74.844 kg)        Primary Care Provider:  Joretta Bachelorajkumar Warrier, MD   [facility] by:  No ref. provider found    [facility]     Breast Self-Exam:  YES    Review of Systems:   General:  no fever, chills, or body aches   Skin:  no hives, itching or change in skin color   Eyes:  no blurred vision, eye pain, or discharge   Ears:  no ear pain, discharge, or difficulty hearing   Nose: no nasal congestion, discharge, or bleeding   Mouth:  no sore throat or difficulty swallowing   Neck:  no pain or swelling   Respiratory:  no shortness of breath, cough, wheezing, or bloody sputum   Cardiac:  no chest pains or palpitations   G/I:  no nausea, vomiting, diarrhea, constipation, or abdominal pain   Musculo-Skeletal:  no joint or muscle pain, no back pain   G/U:  no dysuria, frequency of urination, or slow stream   Neuro:  no slurred speech, seizures, or localized weakness   Other Systems:  WNL    Past Medical History   Diagnosis Date   ??? Osteoarthritis    ??? DDD (degenerative disc disease)    ??? Migraine    ??? Fibromyalgia    ??? OA (osteoarthritis)    ??? Carpal tunnel syndrome      Past Surgical History   Procedure Laterality Date   ??? Hx orthopaedic       CTR, Rotator cuff repair   ??? Hx heent       bilat eye   ??? Hx carpal tunnel release Right    ??? Pr anesth,surgery of shoulder Right      Due to osteoarthritis & rotator cuff tendonopathy.   ??? Hx dilation and curettage       & hysteroscopy due to uterine polyps     Family History   Problem Relation Age of Onset   ??? Arthritis-osteo Mother    ??? Heart Disease Mother    ??? Diabetes Mother    ??? Arthritis-rheumatoid Mother    ??? Hypertension Mother    ??? SLE Mother    ??? Coronary Artery Disease Mother    ??? Cancer Father    ??? Diabetes Father    ??? Hypertension Father    ??? Hypertension Sister    ??? Heart Disease Brother    ??? Hypertension Brother    ??? Diabetes Brother    ??? Coronary Artery Disease Brother    ??? Heart Disease Brother    ??? Hypertension Brother    ??? Diabetes Brother    ??? Coronary Artery Disease Brother      History     Social History   ??? Marital Status: MARRIED     Spouse Name: N/A      Number of Children: N/A   ??? Years of Education: N/A     Social History Main Topics   ??? Smoking status: Never Smoker    ??? Smokeless tobacco: Not on file   ??? Alcohol Use: Yes      Comment: social   ??? Drug Use: No   ??? Sexually Active: Yes     Other Topics Concern   ??? Not on file     Social History Narrative   ??? No narrative on file     Physical Examination:   General appearance - alert, well appearing, and in no distress   Skin - normal coloration and turgor, no rashes   Mental status - alert, oriented to person, place, and time   Neck - supple, no significant adenopathy   Lymphatics - no palpable lymphadenopathy, no hepatosplenomegaly   Lungs - clear to auscultation, no wheezes, rales or rhonchi, symmetric air entry   Breasts - no adenopathy, dimpling, or skin retractions noted, no discharge and non-tender   Heart - normal rate, regular rhythm, normal S1, S2, no murmurs, rubs, clicks or gallops   Abdomen - soft, non-tender, positive bowel sounds   Extremities - without clubbing, cyanosis or edema   Neurological - alert, oriented x3   Musculoskeletal - no joint tenderness, deformity or swelling   Pelvic -     EGBUS: WNL    VAGINA: pink and rugated    CERVIX: without lesion    UTERUS: AV, NSSC, [place], NT    ADNEXA: non-tender, no masses    Rectal - negative guaiac test    Inguinal  Lymph Nodes - negative    Recto-Vaginal - no masses and NT    Diagnostic Test Results:      Decision Making (PLAN):    ICD-9-CM    1. Routine gynecological examination V72.31 PAP, LB, RFX HPV [id number])   2. Menopause V49.81      Orders Placed This Encounter   ??? PAP, LB, RFX HPV [id number])     Other Notes:  This is a 53 yo for an annual with no complaints.  She is menopausal, and is scheduled for a colonoscopy.  Heer mammogram is scheduled also.    Follow-up Disposition: Not on File  Verl Dicker, MD  10/27/2013 10:35 AM

## 2013-10-31 LAB — PAP, LB, RFX HPV ASCUS(507301)

## 2013-10-31 NOTE — Addendum Note (Signed)
Addended by: Patton SallesBENTLEY, Dossie Ocanas N on: 10/31/2013 07:52 AM     Modules accepted: Level of Service

## 2013-10-31 NOTE — Progress Notes (Signed)
Quick Note:    Letter sent  ______

## 2013-10-31 NOTE — Progress Notes (Signed)
Quick Note:    Please notify patient of normal pap via letter  ______

## 2013-11-30 MED ORDER — LIDOCAINE 5 % (700 MG/PATCH) ADHESIVE PATCH
5 % | PACK | CUTANEOUS | Status: DC
Start: 2013-11-30 — End: 2014-05-23

## 2013-11-30 NOTE — Progress Notes (Signed)
[facility]  [address], [place], [place] [address]  Phone:  [phone]  Fax:  [phone]  Dimas Aguas L. Cheri Guppy, D.O., F.A.C.O.I., F.A.C.R.  Pamalee Leyden, MSN, APRN, FNP-BC  Curlene Dolphin, PA-C      Patient Name:  Rachael Brown.O.B:  11-20-1960    Date of Service:  11/30/2013   PCP:  Joretta Bachelor, MD    CC:  Right hip pain, foot pain.   Follow up on Osteoarthritis, DDD, and Carpal Tunnel syndrome.     HPI:  Rachael Brown is being seen with complaint of right hip pain.  The pain is in the groin area, and is always worse after she has been on her feet all day working, or after she walks for exercise.   She notes it on arising from a chair especially.   She also notes that she continues to have pain in both feet that gets gradually worse throughout the day, and moves higher up the legs.  She reports it is often a "burning" sensation.  She reports that in the past she tried Gabapentin, Lyrica and Savella but could not tolerate these medications due to visual disturbance.  She denies any further problems with Carpal Tunnel Syndrome.  She is no longer being awakened by pain and tingling in her hands.  She continues to have low back pain, somewhat relieved with the Lidoderm patches.  She has been doing the flexing and stretching exercises as directed.      She denies GI upset or side effects from medications.  Morning stiffness is lasting  <5  Minutes.  No new neurologic complaints.  She denies new skin changes.   She denies new medications or changes since last visit.    Physical examination:  BP 112/78    Pulse 66    Resp 16    Ht 5\' 2"  (1.575 m)    Wt 165 lb (74.844 kg)    BMI 30.17 kg/m2     Heart regular rate and rhythm with no rubs.    Lungs clear to auscultation with no rubs.    Joint -    Spine: Cervical thoracic and lumbar spine have stable functional ROM with no    new deformity.  There is no scoliosis.  There is no change in the lordotic and    kyphotic curvatures.     Shoulder:  Shoulders have stable active and passive ROM. There is no pain on    ROM testing.  There are no effusions or synovitis.  There is no warmth or   erythema.   Elbow: Elbow ROM is unchanged.  There is no active synovitis, effusion,    warmth, or erythema.     Wrist: Wrists have stable flexion and extension with no active synovitis or    effusion.    Finger: MCP and PIP joints have no active synovitis or effusion. DIP and first    CMC joints are unchanged.  There is no active synovitis or effusion of the DIP    and CMC joints.      Hip: Hips have stable ROM.  There is no pain on palpation or ROM.     Knees: Knee flexion and extension is unchanged.  There are no new palpable    deformities.  There are no effusions or active synovitis  There is no warmth or    erythema.   Ankle: Ankles have stable ROM with no effusion or synovitis.    Toe: MTP  and distal toe joints are unchanged.  There is no active synovitis, warmth, erythema  or effusion.  Neurologic examination is unchanged. Cranial nerves are intact. Motor and sensory  examinations are unchanged. Mini mental status examination is within normal limits. Reflexes are within normal limits. No other new neurologic changes    Impressions:  Encounter Diagnoses     ICD-9-CM   1. DDD (degenerative disc disease) 722.6   2. OA (osteoarthritis) 715.90   3. Carpal tunnel syndrome, unspecified laterality 354.0   4. Right hip pain 719.45   5. NSAID long-term use V58.64   6. Neuropathic pain 729.2         Plan:  1. Continue current medications. Prescription renewal as noted.    2. Treatment alternatives were discussed.  3. Will obtain X-ray of the right hip.  The patient may be a candidate for a fluoro guided hip injection  4. Lab for monitoring of disease and treatment.   5. Prior lab were reviewed and reviewed with the patient.  No cause for neuropathic pain identified.   6. Current home physical therapy exercise program was reviewed.  7. [name] was given detailed  instructions in a home physical therapy exercise program.  8. Proper dosing of calcium and vitamin D were reviewed.  9. Home safety precautions and activity restrictions were discussed.  Orders Placed This Encounter   ??? XR HIP RT AP/LAT MIN 2 V     Standing Status: Future      Number of Occurrences:       Standing Expiration Date: 05/29/2014     Order Specific Question:  Reason for Exam     Answer:  r hip pain     Order Specific Question:  Is Patient Allergic to Contrast Dye?     Answer:  No   ??? METABOLIC PANEL, COMPREHENSIVE     Standing Status: Future      Number of Occurrences:       Standing Expiration Date: 05/29/2014   ??? POC COMPLETE CBC, AUTOMATED ENTER ([id number])   ??? lidocaine (LIDODERM) 5 %(700 mg/patch)     Sig: Apply patch to the affected area for 12 hours a day and remove for 12 hours a day.     Dispense:  3 Package     Refill:  1     Follow-up Disposition:  Return in about 3 months (around 02/27/2014), or if symptoms worsen or fail to improve.      Mendel RyderSusan M Jamisen Hawes, NP

## 2013-12-01 NOTE — Telephone Encounter (Signed)
Patient has [facility] done at [facility]    I called to request them    Solmon IceAngie Conner Neiss

## 2013-12-02 LAB — METABOLIC PANEL, COMPREHENSIVE
A-G Ratio: 1.3 (ref 1.2–2.2)
ALT (SGPT): 35 U/L (ref 12–78)
AST (SGOT): 25 U/L (ref 15–37)
Albumin: 4 g/dL (ref 3.4–5.0)
Alk. phosphatase: 70 U/L (ref 45–117)
Anion gap: 6 mmol/L (ref 6–15)
BUN/Creatinine ratio: 17 (ref 7–25)
BUN: 10 MG/DL (ref 7–18)
Bilirubin, total: 0.3 MG/DL (ref <1.1)
CO2: 28 mmol/L (ref 21–32)
Calcium: 9.1 MG/DL (ref 8.5–10.1)
Chloride: 106 mmol/L (ref 98–107)
Creatinine: 0.6 MG/DL (ref 0.60–1.30)
GFR est AA: >60 mL/min/{1.73_m2} (ref >60)
GFR est non-AA: >60 mL/min/{1.73_m2} (ref >60)
Globulin: 3.1 g/dL (ref 2.4–3.5)
Glucose: 107 mg/dL (ref 70–110)
Potassium: 3.9 mmol/L (ref 3.5–5.3)
Protein, total: 7.1 g/dL (ref 6.4–8.2)
Sodium: 140 mmol/L (ref 136–145)

## 2013-12-04 LAB — AMB POC COMPLETE CBC,AUTOMATED ENTER
ABS. LYMPHS (POC): 2.3 10*3/uL (ref 0.6–4.1)
HCT (POC): 40.5 % (ref 37–51)
HGB (POC): 12.4 g/dL (ref 12–18)
LYMPHOCYTES (POC): 34.3 % (ref 10–58.5)
MCH (POC): 28.9 pg (ref 26–32)
MCHC (POC): 30.6 g/dL — AB (ref 31–36)
MCV (POC): 94.4 fL (ref 80–97)
PLATELET (POC): 262 10*3/uL (ref 140–440)
RBC (POC): 4.29 M/uL (ref 4.2–6.3)
WBC (POC): 6.6 10*3/uL (ref 4.1–10.9)

## 2013-12-05 ENCOUNTER — Encounter

## 2013-12-05 NOTE — Telephone Encounter (Signed)
Patient was wondering if Rachael PikesSusan thought she should go to interval radioloist for hip injection    She want to [facility] a [facility]

## 2013-12-06 ENCOUNTER — Encounter

## 2013-12-08 MED ORDER — FLURBIPROFEN 100 MG TAB
100 mg | ORAL_TABLET | Freq: Two times a day (BID) | ORAL | Status: DC
Start: 2013-12-08 — End: 2014-02-22

## 2013-12-11 NOTE — Telephone Encounter (Signed)
Patient called and was asking when or if there was an order faxed to [facility].It was sent and is scheduled for this Friday March 13.

## 2013-12-13 NOTE — Telephone Encounter (Signed)
Clarification on Ansaid for 90 days supply with 1 refill    Angie Benna DunksBarber

## 2013-12-22 MED ORDER — TIZANIDINE 4 MG TAB
4 mg | ORAL_TABLET | ORAL | Status: DC
Start: 2013-12-22 — End: 2014-02-27

## 2013-12-28 ENCOUNTER — Encounter

## 2014-01-05 MED ORDER — INDOMETHACIN SR 75 MG CAP
75 mg | ORAL_CAPSULE | Freq: Two times a day (BID) | ORAL | Status: AC
Start: 2014-01-05 — End: 2014-04-05

## 2014-01-05 NOTE — Telephone Encounter (Signed)
Pt send MyChart msg stating Orudis was not available from [facility] and would like something to replace it. Per Sam McArthur,PA-C, Indocin 75mg  BID. Pt notified.

## 2014-01-18 NOTE — Telephone Encounter (Signed)
Per Rachael PikesSusan Pt should be taking Indocin because Orudis was unavailable, Ansaid discontinued. Pt notified and is aware.  Rachael Palmerharlene Brown Rachael Brown

## 2014-01-18 NOTE — Telephone Encounter (Signed)
Pt called stating that she received Ansaid 100 mg BID and a week later received Indocin 75 mg the week after

## 2014-02-22 MED ORDER — METHYLPREDNISOLONE 20 MG/ML SUSP FOR INJECTION
20 mg/mL | Freq: Once | INTRAMUSCULAR | Status: AC
Start: 2014-02-22 — End: 2014-02-22

## 2014-02-22 NOTE — Addendum Note (Signed)
Addended by: Mendel Ryder on: 02/22/2014 01:04 PM     Modules accepted: Kipp Brood

## 2014-02-22 NOTE — Progress Notes (Signed)
[facility]  [address] [place] [address]  Phone:  [phone]  Fax:  [phone]  Doddridge. Norton Blizzard, D.O., F.A.C.O.I., F.A.C.R.  Gerarda Gunther, MSN, APRN, FNP-BC  Lottie Dawson, PA-C      Patient Name:  Rachael Brown.O.B:  17-Apr-1961    Date of Service:  02/22/2014   PCP:  Garvin Fila, MD    CC:  Fatigue, musculoskeletal pain.   Follow up on Osteoarthritis, DDD, and Carpal Tunnel syndrome.     HPI:  Rachael Brown is being seen with complaint of fatigue, and not feeling well over all.  She had lab work as part of wellness visit, but no clues as to the fatigue were identified.   She reports she feels achy, out of proportion with the weather.   She reports the hip injection did help, but that she is having worsening carpal tunnel symptoms.    Upon further questioning, she has difficulty getting up without pulling if she sits on the floor.   She reports muscle aches "all over" not just in the proximal muscles.   She denies jaw claudication or palpable area of tenderness in the temporal area.   She denies amaurosis fugax.      She denies GI upset or side effects from medications.  Morning stiffness is lasting  <5  Minutes.  No new neurologic complaints.  She denies new skin changes.   She denies new medications or changes since last visit.    Physical examination:  BP 154/103   Pulse 64   Resp 16   Ht '5\' 2"'  (1.575 m)   Wt 165 lb (74.844 kg)   BMI 30.17 kg/m2  Heart regular rate and rhythm with no rubs.    Lungs clear to auscultation with no rubs.    Joint - There is no trigger point tenderness.      Spine: Cervical thoracic and lumbar spine have stable functional ROM with no    new deformity.  There is no scoliosis.  There is no change in the lordotic and    kyphotic curvatures.     Shoulder: Shoulders have stable active and passive ROM. There is no pain on    ROM testing.  There are no effusions or synovitis.  There is no warmth or   erythema.   Elbow: Elbow ROM is unchanged.   There is no active synovitis, effusion,    warmth, or erythema.     Wrist: Wrists have stable flexion and extension with no active synovitis or    effusion.   Area of erythema, possible bruising inner aspect of left wrist.    Finger: MCP and PIP joints have no active synovitis or effusion. DIP and first    CMC joints are unchanged.  There is no active synovitis or effusion of the DIP    and CMC joints.      Hip: Hips have stable ROM.  There is no pain on palpation or ROM.     Knees: Knee flexion and extension is unchanged.  There are no new palpable    deformities.  There are no effusions or active synovitis  There is no warmth or    erythema.   Ankle: Ankles have stable ROM with no effusion or synovitis.    Toe: MTP and distal toe joints are unchanged.  There is no active synovitis, warmth, erythema  or effusion.  Neurologic Cranial nerves are intact. Motor and sensory examinations are unchanged, with  the exception of positive Tinel sign bilaterally. Mini mental status examination is within normal limits. Reflexes are within normal limits. No other new neurologic changes     Impressions:  Encounter Diagnoses     ICD-9-CM   1. Carpal tunnel syndrome, bilateral 354.0   2. DDD (degenerative disc disease) 722.6   3. Primary osteoarthritis involving multiple joints 715.09   4.  ESR 41 with muscle complaint-Will observe for signs of PMR    Carpal Tunnel Injection  Carpal Tunnel Injection sites were identified.  Injection site was swabbed with alcohol.  Local anesthesia was obtained with ethyl chloride spray.  Right carpal tunnel was injected with 57m Depomedrol.  Hemostasis was obtained with pressure.  Injection on the left deferred today due to erythema.   A bandage was applied.  Post injection care was discussed with the patient.  Post injection activity was discussed with the patient.  Use of cock up wrist splints was reviewed with the patient.    Plan:  1. Discussed the potential risks, benefits and alternatives to  injections as well as the after care prior to injection.   2. Continue current medications.  3. Treatment alternatives were discussed.  4. Prior lab were reviewed and reviewed with the patient.  See media  5. Current home physical therapy exercise program was reviewed.  6. Ms. PMaggiowas given detailed instructions in a home physical therapy exercise program.  7. Proper dosing of calcium and vitamin D were reviewed.  8. Home safety precautions and activity restrictions were discussed.  9. The patient will return on Tuesday for injection on the left if erythema has resolved.    10. The patient reports she had bone density testing done here at the office but no result is found.  Will ask the staff locate the scan.   Orders Placed This Encounter   ??? METHYLPREDNISOLONE ACETATE INJECTION 20 MG     Order Specific Question:  Dose     Answer:  215m    Order Specific Question:  Site     Answer:  RIGHT WRIST     Order Specific Question:  Expiration Date     Answer:  07/05/2016     Order Specific Question:  Lot#     Answer:  [id number]   Order Specific Question:  Manufacturer     Answer:  [employer]     Order Specific Question:  Charge Quantity?     Answer:  1     Order Specific Question:  Perfomed by/Witnessed by:     Answer:  DM   ??? methylPREDNISolone acetate (DEPO-MEDROL) 20 mg/mL susp     Sig: 1 mL by IntraLESional route once for 1 dose.     Dispense:  1 Vial     Refill:  0   ??? methylPREDNISolone acetate (DEPO-MEDROL) 20 mg/mL susp     Sig: 1 mL by IntraMUSCular route once for 1 dose.     Dispense:  1 Vial     Refill:  0     Follow-up Disposition:  Return in about 3 months (around 05/25/2014), or and Tuesday for injection of left carpal tunnel, for Injection.      SuGeanie KenningNP

## 2014-02-27 MED ORDER — METHYLPREDNISOLONE 20 MG/ML SUSP FOR INJECTION
20 mg/mL | Freq: Once | INTRAMUSCULAR | Status: DC
Start: 2014-02-27 — End: 2014-03-26

## 2014-02-27 NOTE — Progress Notes (Signed)
[facility]  [address] [place], [place] [address]  Phone:  [phone]  Fax:  [phone]  Dimas Aguas L. Cheri Guppy, D.O., F.A.C.O.I., F.A.C.R.  Pamalee Leyden, MSN, APRN, FNP-BC  Curlene Dolphin, PA-C      Patient Name:  Rachael Brown.O.B:  Feb 23, 1961    Date of Service:  02/27/2014   PCP:  Joretta Bachelor, MD    Carpal Tunnel Injection  Carpal Tunnel Injection site were identified.  Injection site was swabbed with alcohol.  Local anesthesia was obtained with ethyl chloride spray.  Left carpal tunnel was injected with 20mg  Depomedrol.  No further erythema noted. Hemostasis was obtained with pressure.  A bandage was applied.  Post injection care was discussed with the patient.  Post injection activity was discussed with the patient.  Use of cock up wrist splints was reviewed with the patient.    Mendel Ryder, NP

## 2014-03-05 MED ORDER — TIZANIDINE 4 MG TAB
4 mg | ORAL_TABLET | ORAL | Status: DC
Start: 2014-03-05 — End: 2014-05-23

## 2014-03-05 NOTE — Telephone Encounter (Signed)
Patient wants to have her right hip injection at [facility] due to insurance in [place]

## 2014-03-15 ENCOUNTER — Telehealth

## 2014-03-15 NOTE — Telephone Encounter (Signed)
Left msg on pts cell requesting a return call. Needing more information re: neuropathy site(s), duration, etc. Instructed pt to call back in the AM if she cannot reach me this evening. Spoke with Bevelyn BucklesSusan Aliff,APRN, she states the referral is acceptable if pt is symptomatic and experiencing problems with Carpal Tunnel, ok to send.

## 2014-03-15 NOTE — Telephone Encounter (Signed)
Pt requests referral to Dr. Deatra James for neuropathy.

## 2014-03-16 NOTE — Telephone Encounter (Signed)
Pt returned phone call. Referral sent to Dr. Deatra JamesBajorek. Office is aware and will schedule the appt.

## 2014-03-26 MED ORDER — METHYLPREDNISOLONE 20 MG/ML SUSP FOR INJECTION
20 mg/mL | Freq: Once | INTRAMUSCULAR | Status: AC
Start: 2014-03-26 — End: 2014-03-26

## 2014-03-27 NOTE — Telephone Encounter (Addendum)
Pt called stating that she needs an order placed for her Hip injection to be done @ [facility]  And that they have a new fax number [phone]. Order printed and faxed.

## 2014-05-08 MED ORDER — AMITRIPTYLINE 10 MG TAB
10 mg | ORAL_TABLET | ORAL | Status: DC
Start: 2014-05-08 — End: 2015-03-21

## 2014-05-08 NOTE — Progress Notes (Signed)
Sedalia MutaSusan Vaudine Dutan PA-C  [address], [address]  [place], [place] [id number]  Phone:  [phone]([phone]  Fax:  [phone]) [phone]     Neurology Consult    Patient ID  Name:  Rachael Brown  DOB:  1961/04/02  MRN:  [id number]  Age:  53 y.o.  PCP:  None (General)  Referring Provider:  Cordelia PocheHoward Feinberg    Subjective:     Encounter Date:  05/08/2014    Chief Complaint   Patient presents with   ??? New Patient     referred by Dr. Cheri GuppyFeinberg, bilateral feet painful, hot, pins/needles affect radiates up left leg, this started after back surgery in 10/2010       History of Present Illness:   Rachael Brown is a 53 y.o.   right handed Caucasian female who presents for neurologic consultation regarding neuropathy. Patient states that in 2012 she had cervical disc surgery.  After that time, she started having burning in her feet.  She now has burning and a needle sensation with walking.  She is also having pain in the left leg as if it starts and the top and th bottom and meets at the knee.     She is not Diabetic and her B12 and Folate are normal.  She does have family members with similar complaints bur most oar diabetic.  She admits to back pain.   No falls.  Pain is worse with walking and standing.      She is a recently retired Engineer, sitemedical assistant.      She has tried Neurontin, Cymbalta, Sevella, Lyrica.  They all seem to cause edema and tunnel vision.   She has not had EMG of her LE or a recent MRI of her back.      Outpatient Prescriptions Marked as Taking for the 05/08/14 encounter (Office Visit) with Wynonia SoursSusan M Banks Chaikin, PA-C   Medication Sig Dispense Refill   ??? indomethacin SR (INDOCIN SR) 75 mg SR capsule Take 75 mg by mouth daily.     ??? amitriptyline (ELAVIL) 10 mg tablet 1 tab QHS for 1 week then 2 tabs QHS and then may go to 3 tabs QHS.  Indications: NEUROPATHIC PAIN 270 Tab 3   ??? tiZANidine (ZANAFLEX) 4 mg tablet TAKE 1 TABLET THREE TIMES A DAY AND 1 TABLET AT BEDTIME 360 Tab 1    ??? lidocaine (LIDODERM) 5 %(700 mg/patch) Apply patch to the affected area for 12 hours a day and remove for 12 hours a day. 3 Package 1   ??? diclofenac (VOLTAREN) 1 % topical gel Apply 4 g to affected area four (4) times daily. 100 g 1   ??? fluticasone (FLONASE) 50 mcg/actuation nasal spray      ??? aspirin 81 mg tablet Take 81 mg by mouth.     ??? Dexlansoprazole (DEXILANT) 60 mg CpDM Take  by mouth.       Allergies   Allergen Reactions   ??? Codeine Unknown (comments)   ??? Percocet [Oxycodone-Acetaminophen] Unknown (comments)     Patient Active Problem List   Diagnosis Code   ??? OA (osteoarthritis) 715.90   ??? DDD (degenerative disc disease) 722.6   ??? Carpal tunnel syndrome 354.0   ??? Routine gynecological examination V72.31   ??? Menopause 627.2   ??? Neuropathy (HCC) 355.9   ??? Bilateral low back pain with left-sided sciatica 724.3   ??? H/O cervical spine surgery V45.89     Past Medical History   Diagnosis Date   ???  Osteoarthritis    ??? DDD (degenerative disc disease)    ??? Migraine    ??? Fibromyalgia    ??? OA (osteoarthritis)    ??? Carpal tunnel syndrome    ??? Menopause       Past Surgical History   Procedure Laterality Date   ??? Hx orthopaedic       CTR, Rotator cuff repair   ??? Hx heent       bilat eye   ??? Hx carpal tunnel release Right    ??? Pr anesth,surgery of shoulder Right      Due to osteoarthritis & rotator cuff tendonopathy.   ??? Hx dilation and curettage       & hysteroscopy due to uterine polyps      Family History   Problem Relation Age of Onset   ??? Arthritis-osteo Mother    ??? Heart Disease Mother    ??? Diabetes Mother    ??? Arthritis-rheumatoid Mother    ??? Hypertension Mother    ??? SLE Mother    ??? Coronary Artery Disease Mother    ??? Cancer Father    ??? Diabetes Father    ??? Hypertension Father    ??? Hypertension Sister    ??? Heart Disease Brother    ??? Hypertension Brother    ??? Diabetes Brother    ??? Coronary Artery Disease Brother    ??? Heart Disease Brother    ??? Hypertension Brother    ??? Diabetes Brother     ??? Coronary Artery Disease Brother       History     Social History   ??? Marital Status: MARRIED     Spouse Name: N/A     Number of Children: N/A   ??? Years of Education: N/A     Social History Main Topics   ??? Smoking status: Never Smoker    ??? Smokeless tobacco: Not on file   ??? Alcohol Use: Yes      Comment: social   ??? Drug Use: No   ??? Sexual Activity: Yes     Other Topics Concern   ??? Not on file     Social History Narrative       Review of Systems   Constitutional: Negative for fever, chills and malaise/fatigue.   HENT: Negative for hearing loss and tinnitus.    Eyes: Negative for blurred vision, double vision and photophobia.   Respiratory: Negative for shortness of breath.    Cardiovascular: Negative for chest pain.   Gastrointestinal: Negative for nausea, vomiting and constipation.   Musculoskeletal: Positive for back pain, joint pain and neck pain. Negative for myalgias and falls.   Skin: Negative for rash.   Neurological: Positive for tingling and sensory change. Negative for dizziness, tremors, speech change, focal weakness, seizures, loss of consciousness, weakness and headaches.   Endo/Heme/Allergies: Does not bruise/bleed easily.   Psychiatric/Behavioral: Negative for depression and memory loss. The patient is not nervous/anxious and does not have insomnia.           Objective:     Filed Vitals:    05/08/14 1316   BP: 145/85   Pulse: 77   Height: 5\' 2"  (1.575 m)   Weight: 78.926 kg (174 lb)       Physical Exam   Constitutional: She is oriented to person, place, and time and well-developed, well-nourished, and in no distress.   HENT:   Head: Normocephalic and atraumatic.   Neck: Neck supple.   Pulmonary/Chest: Effort normal.  Musculoskeletal: Normal range of motion.   Neurological: She is alert and oriented to person, place, and time. She has normal sensation, normal strength and intact cranial nerves. She displays no weakness, no atrophy, no tremor and facial symmetry. She  exhibits normal muscle tone. She has an abnormal Romberg Test. She has a normal Straight Leg Raise Test, a normal Cerebellar Exam, a normal Finger-Nose-Finger Test, a normal Heel to [employer] and a normal Tandem Gait Test. She shows no pronator drift. Coordination and gait normal.   Reflex Scores:       Tricep reflexes are 2+ on the right side and 2+ on the left side.       Bicep reflexes are 2+ on the right side and 2+ on the left side.       Brachioradialis reflexes are 2+ on the right side and 2+ on the left side.       Patellar reflexes are 2+ on the right side and 2+ on the left side.       Achilles reflexes are 2+ on the right side and 2+ on the left side.  Decreased sensation to PP and TEMP tdistal LE  VIB is normal.         Review of Additional Data: office notes, referral letter/letters, lab reports    Impression:     Encounter Diagnoses     ICD-9-CM   1. Neuropathy (HCC) 355.9   2. Bilateral low back pain with left-sided sciatica 724.3   3. DDD (degenerative disc disease) 722.6   4. H/O cervical spine surgery V45.89         Plan:     Ordered: MRI, EMG, B6, B1, SPEP  Will start Elavil for pain and Compound cream for her feet.        Follow-up Disposition:  Return for AFTER TESTING.  Time Spent:  45 minutes  50% of face to face time today spent counseling patient regarding The primary encounter diagnosis was Neuropathy (HCC). Diagnoses of Bilateral low back pain with left-sided sciatica, DDD (degenerative disc disease), and H/O cervical spine surgery were also pertinent to this visit. and coordinating of care.    Signed By:  Wynonia Sours, PA-C     05/08/2014

## 2014-05-08 NOTE — Progress Notes (Signed)
Faxed script to [facility]

## 2014-05-08 NOTE — Patient Instructions (Signed)
[facility]  Office working hours are Monday - Thursday 9:00 am to 5:00 pm.  Friday 8 am to 12 noon with limited staff.  Office phone number is [phone] and fax is [phone].  For non-emergent medical care and clinical advice during office hours:   1. Call office or   2.  Send message or request using MyChart  For non-emergent medical care and clinical advice after office hours:  1.  Call [phone] or  2.  Send message or request using MyChart  Emergency care can be obtained at the [facility] ER, Urgent Care or calling 911.    Patient Satisfaction Survey  We appreciate you giving us your e-mail address.  Please watch for our patient satisfaction survey which you will receive by e-mail.  We strive to provide you with the best care possible.  We respect all comments and will take comments into consideration to improve our service.  Thank you for your participation.

## 2014-05-23 LAB — SED RATE, AUTOMATED: Sed rate, automated: 12 mm/hr (ref 0–25)

## 2014-05-23 MED ORDER — LIDOCAINE 5 % (700 MG/PATCH) ADHESIVE PATCH
5 % | PACK | CUTANEOUS | Status: DC
Start: 2014-05-23 — End: 2014-08-06

## 2014-05-23 MED ORDER — TIZANIDINE 4 MG TAB
4 mg | ORAL_TABLET | Freq: Every evening | ORAL | Status: DC
Start: 2014-05-23 — End: 2014-08-06

## 2014-05-23 NOTE — Patient Instructions (Signed)
Basic Conditioning Exercises For The Back    1. Pelvic Tilt: Take a deep breath; exhale slowly. Keeping the legs bent, tighten your abdominal and buttock muscles and push the small of your back flat into the floor. Hold for a count of 10, and repeat slowly 5 times.       2. Single Leg Flex:  With both hands bring one knee at a time up as near to your chest as possible, keeping the other leg bent. Hold for a count of 10 and repeat 5 times with each leg.      3.  Both Leg Flex:  Tighten your abdominal muscles and bring both knees up to your chest.  Hold for a count of 10, and repeat slowly 5 times.      4.  Partial Sit-Up:  Lie with both hands across your chest and both legs bent.  Curl up with your head flexed, so your chin is close to your chest until your shoulder blades are off the floor.  Hold for a count of 10.  Slowly lower your shoulders onto the floor, keeping your head flexed. Repeat 5 times slowly before relaxing your head.                          5.  Hip Rotation: Lie on your back with hands under the back of your neck.  Keeping one leg straight, bend the other leg and lift the foot over the straight leg and place on the floor next to the knee.  Keeping the shoulders in place, lift the buttock of the bent leg and rotate the hips so the bent knee approaches the floor the other side of the straight leg. Hold for a count of 5, and repeat 5 times with each leg.      6. Prone Relax:  Lie face down with head turned to one side and arms at your sides. Try to relax into the position and feel the release of tension in the muscles of your back.  Hold the position for 5 minutes.      Quad Set    Purpose: To strengthen your thigh muscles (quadricepts).    Position: Half sit with your weak leg as straight as possible. Bend the other leg as illustrated.    Action:Tighten (flex) the muscles on the top of your thigh. This will make your knee cap move toward your hip. Your leg should still be straight and  lying on the bed. Hold for five seconds. Relax. Repeat    Straight Leg Raise    Purpose: To strengthen your thigh muscles (quadricepts).    Position: Lie on your back with your weak leg as straight as possible. Bend your other leg as illustrated to protect your back.    Action: Tighten your thigh muscle. Raise your leg as to an angle similar to what is illustrated while keeping your leg muscles tight and as straight as possible. Hold for three(3) seconds. Lower your leg slowly back to the ground. Relax your leg muscles. Repeat

## 2014-05-23 NOTE — Progress Notes (Signed)
[facility]  [address], [place] [address]  Phone:  [phone]  Fax:  [phone]  Dodge. Norton Blizzard, D.O., F.A.C.O.I., F.A.C.R.  Gerarda Gunther, MSN, APRN, FNP-BC  Lottie Dawson, PA-C      Patient Name:  Rachael Brown.O.B:  [birth year]    Date of Service:  05/23/2014   PCP:  None    CC:  Fatigue, musculoskeletal pain.   Follow up on Osteoarthritis, DDD, and Carpal Tunnel syndrome.     HPI:  Rachael Brown is being seen with complaint of fatigue, and musculoskeletal pain.   She reports she has retired from her work in the interim since her last visit.   She has been resting well, but has not yet resumed her aerobic exercise.    She reports she is now ready to resume, but needed "a break."   Due to the ongoing musculoskeletal pain, she remains concerned about the possibility of polymyalgia rheumatica.   Additionally, she followed up with Neurology in the interim since her last visit, and is scheduled for an EMG, and recently had an MRI of the lumbar spine.  She reports she has had continued low back pain.  She denies any further problems with carpal tunnel syndrome symptoms.      Upon further questioning, she still has difficulty getting up without pulling if she sits on the floor.   She reports muscle aches "all over" not just in the proximal muscles.   She denies jaw claudication or palpable area of tenderness in the temporal area.   She denies amaurosis fugax.      She denies GI upset or side effects from medications.  Morning stiffness is lasting  <5  Minutes.  No new neurologic complaints.  She denies new skin changes.   She denies new medications or changes since last visit.      Patient seen with report of continued fatigue - unchanged from last evaluation.  She denies functional problems or restrictions from the fatigue. She states that this does not require additional treatment at this time.  She denies chest pain, shortness of breath or GI upset.   Denies new cognitive impairment.    She reports continued generalized pain/tenderness consistent with Fibromyalgia.  Currently sleeping 4-6 uninterrupted hours per night.  Reports waking fatigued in AM at times.  Reports not doing exercise since she retired but is now ready to resume.  Widespread pain index 10.  Severity scale 5 (Waking unrefreshed 2, Cognitive 0, fatigue 3, somatic 0).     Physical examination:  BP 144/97 mmHg   Pulse 69   Resp 16   Ht '5\' 2"'  (1.575 m)   Wt 167 lb (75.751 kg)   BMI 30.54 kg/m2  Heart regular rate and rhythm with no rubs.    Lungs clear to auscultation with no rubs.    Joint - There is trigger point tenderness consistent with fibromyalgia.   The only variation in the pattern is that the trigger points on the upper extremities were more tender proximally than distally.  See Widespread Pain Index and Severity Scale.     Spine: Cervical thoracic and lumbar spine have stable functional ROM with no    new deformity.  There is no scoliosis.  There is no change in the lordotic and    kyphotic curvatures.     Shoulder: Shoulders have stable active and passive ROM. There is no pain on    ROM testing.  There are  no effusions or synovitis.  There is no warmth or   erythema.   Elbow: Elbow ROM is unchanged.  There is no active synovitis, effusion,    warmth, or erythema.     Wrist: Wrists have stable flexion and extension with no active synovitis or    effusion.    Finger: MCP and PIP joints have no active synovitis or effusion. DIP and first    CMC joints are unchanged.  There is no active synovitis or effusion of the DIP    and CMC joints.      Hip: Hips have stable ROM.  There is no pain on palpation or ROM.     Knees: Knee flexion and extension is unchanged.  There are no new palpable    deformities.  There are no effusions or active synovitis  There is no warmth or    erythema.   Ankle: Ankles have stable ROM with no effusion or synovitis.     Toe: MTP and distal toe joints are unchanged.  There is no active synovitis, warmth, erythema  or effusion.  Neurologic Cranial nerves are intact. Motor and sensory examinations are unchanged, with negative Tinel sign bilaterally. Mini mental status examination is within normal limits. Reflexes are within normal limits. No other new neurologic changes     Impressions:  Encounter Diagnoses     ICD-9-CM ICD-10-CM   1. Fibromyalgia 729.1 M79.7   2. Primary osteoarthritis involving multiple joints 715.09 M15.0   3. DDD (degenerative disc disease), lumbar 722.52 M51.36   4. Carpal tunnel syndrome, unspecified laterality 354.0 G56.00   4.  ESR 41 with muscle complaint-Will observe for signs of PMR      Plan:   1. Continue current medications.  2. Treatment alternatives were discussed.  3. Prior lab were reviewed and reviewed with the patient.  Will obtain ESR today to further differentiate if PMR is present.    4. Current home physical therapy exercise program was reviewed.  The patient is to resume exercise.    5. Ms. Studzinski was given detailed instructions in a home physical therapy exercise program.  6. Proper dosing of calcium and vitamin D were reviewed.  7. Home safety precautions and activity restrictions were discussed.  8. The patient reports she had bone density testing done here at the office but no result is found.  Will ask the staff locate the scan.   Increase Zanaflex as noted.    Ms. Carvell was instructed to call in 1 week with a sleep report.  Ms. Hacker was reminded about prior instructions and given detailed instructions in proper sleep habits.  Fibromyalgia exercise program was reviewed.  The importance of all components of Fibromyalgia treatment was discussed.  Orders Placed This Encounter   ??? SED RATE, AUTOMATED     Standing Status: Future      Number of Occurrences:       Standing Expiration Date: 11/19/2014   ??? tiZANidine (ZANAFLEX) 4 mg tablet     Sig: Take 4 Tabs by mouth nightly.      Dispense:  360 Tab     Refill:  1   ??? lidocaine (LIDODERM) 5 %(700 mg/patch)     Sig: Apply patch to the affected area for 12 hours a day and remove for 12 hours a day.     Dispense:  3 Package     Refill:  1     Follow-up Disposition: Not on File      Beverly Gust  Leah Skora, NP

## 2014-05-24 LAB — PROTEIN ELECTROPHORESIS
A/G ratio: 1.3 (ref 0.7–2.0)
ALPHA-2 GLOBULIN: 0.8 g/dL (ref 0.4–1.2)
Albumin: 3.8 g/dL (ref 3.2–5.6)
Alpha-1-globulin: 0.2 g/dL (ref 0.1–0.4)
Beta globulin: 1.1 g/dL (ref 0.6–1.3)
Gamma globulin: 0.7 g/dL (ref 0.5–1.6)
Globulin, total: 2.9 g/dL (ref 2.0–4.5)
Protein, total: 6.7 g/dL (ref 6.0–8.5)

## 2014-05-25 LAB — VITAMIN B1, WHOLE BLOOD: Vitamin B1: 112.3 nmol/L (ref 66.5–200.0)

## 2014-05-28 LAB — VITAMIN B6: Vitamin B6: 4.2 ug/L (ref 2.0–32.8)

## 2014-06-07 ENCOUNTER — Encounter

## 2014-06-07 MED ORDER — PYRIDOXINE 50 MG TAB
50 mg | ORAL_TABLET | Freq: Every day | ORAL | Status: DC
Start: 2014-06-07 — End: 2014-06-12

## 2014-06-07 NOTE — Progress Notes (Signed)
Quick Note:        Please call patient and tell her that she is low in her B6 and I sent a prescription to [facility] for 1 month supply and then she is to take an OTC B Complex.    ______

## 2014-06-12 MED ORDER — TRIMETHOPRIM-SULFAMETHOXAZOLE 160 MG-800 MG TAB
160-800 mg | ORAL_TABLET | Freq: Two times a day (BID) | ORAL | Status: AC
Start: 2014-06-12 — End: 2014-06-19

## 2014-06-12 NOTE — ED Provider Notes (Signed)
Patient is a 53 y.o. female presenting with skin problem. The history is provided by the patient. No language interpreter was used.   Skin Problem   This is a new problem. The current episode started more than 2 days ago. The problem has been gradually worsening. The problem is associated with an unknown factor. There has been no fever. The rash is present on the chest. The pain is moderate. The pain has been fluctuating since onset. Associated symptoms include itching and pain. She has tried nothing for the symptoms.        Past Medical History   Diagnosis Date   ??? Osteoarthritis    ??? DDD (degenerative disc disease)    ??? Migraine    ??? Fibromyalgia    ??? OA (osteoarthritis)    ??? Carpal tunnel syndrome    ??? Menopause    ??? Neuropathy [facility])         Past Surgical History   Procedure Laterality Date   ??? Hx orthopaedic       CTR, Rotator cuff repair   ??? Hx heent       bilat eye   ??? Hx carpal tunnel release Right    ??? Pr anesth,surgery of shoulder Right      Due to osteoarthritis & rotator cuff tendonopathy.   ??? Hx dilation and curettage       & hysteroscopy due to uterine polyps         Family History   Problem Relation Age of Onset   ??? Arthritis-osteo Mother    ??? Heart Disease Mother    ??? Diabetes Mother    ??? Arthritis-rheumatoid Mother    ??? Hypertension Mother    ??? SLE Mother    ??? Coronary Artery Disease Mother    ??? Cancer Father    ??? Diabetes Father    ??? Hypertension Father    ??? Hypertension Sister    ??? Heart Disease Brother    ??? Hypertension Brother    ??? Diabetes Brother    ??? Coronary Artery Disease Brother    ??? Heart Disease Brother    ??? Hypertension Brother    ??? Diabetes Brother    ??? Coronary Artery Disease Brother         History     Social History   ??? Marital Status: MARRIED     Spouse Name: N/A     Number of Children: N/A   ??? Years of Education: N/A     Occupational History   ??? Not on file.     Social History Main Topics   ??? Smoking status: Never Smoker    ??? Smokeless tobacco: Not on file   ??? Alcohol Use: Yes       Comment: social   ??? Drug Use: No   ??? Sexual Activity: Yes     Other Topics Concern   ??? Not on file     Social History Narrative                  ALLERGIES: Codeine and Percocet      Review of Systems   Constitutional: Negative for fever, chills, activity change and appetite change.   Musculoskeletal: Negative for myalgias and arthralgias.   Skin: Positive for color change, itching and rash.   All other systems reviewed and are negative.      Filed Vitals:    06/12/14 0921   BP: 151/101   Pulse: 79   Temp: 98.1 ??  F (36.7 ??C)   Resp: 16   Height:  (1.575 m)   Weight: 75.751 kg (167 lb)   SpO2: 98%            Physical Exam   Constitutional: She is oriented to person, place, and time. She appears well-developed and well-nourished. No distress.   HENT:   Head: Normocephalic and atraumatic.   Right Ear: External ear normal.   Left Ear: External ear normal.   Nose: Nose normal.   Mouth/Throat: Oropharynx is clear and moist. No oropharyngeal exudate.   Eyes: Conjunctivae and EOM are normal. Pupils are equal, round, and reactive to light. Right eye exhibits no discharge. Left eye exhibits no discharge. No scleral icterus.   Neck: Normal range of motion. Neck supple. No JVD present. No tracheal deviation present. No thyromegaly present.   Cardiovascular: Normal rate, regular rhythm, normal heart sounds and intact distal pulses.  Exam reveals no gallop and no friction rub.    No murmur heard.  Pulmonary/Chest: Effort normal and breath sounds normal. No stridor. No respiratory distress. She has no wheezes. She has no rales.   Abdominal: Soft. Bowel sounds are normal. She exhibits no distension. There is no tenderness. There is no rebound and no guarding.   Musculoskeletal: Normal range of motion.   Lymphadenopathy:     She has no cervical adenopathy.   Neurological: She is alert and oriented to person, place, and time. She has normal reflexes. No cranial nerve deficit. She exhibits normal muscle  tone. Coordination normal.   Skin: She is not diaphoretic.        Psychiatric: She has a normal mood and affect. Her behavior is normal. Judgment and thought content normal.   Nursing note and vitals reviewed.       MDM  Number of Diagnoses or Management Options  Cellulitis: new and does not require workup  Risk of Complications, Morbidity, and/or Mortality  Presenting problems: low  Diagnostic procedures: low  Management options: low    Patient Progress  Patient progress: stable      Procedures

## 2014-06-12 NOTE — ED Notes (Signed)
C/o red, swollen area below breasts. Onset last Thursday. States that she thinks a bug bit her.

## 2014-06-12 NOTE — ED Notes (Signed)
Awake/alert  Skin w/d  resp  Unlabored  Redness with black area noted in middle noted to mid epigastric area  No drainage noted

## 2014-06-12 NOTE — ED Notes (Signed)
I have reviewed discharge instructions with the patient.  The patient verbalized understanding.

## 2014-06-13 NOTE — Progress Notes (Signed)
Left message to call [facility]

## 2014-06-21 MED ORDER — INDOMETHACIN SR 75 MG CAP
75 mg | ORAL_CAPSULE | Freq: Every day | ORAL | Status: DC
Start: 2014-06-21 — End: 2014-07-12

## 2014-06-21 NOTE — Telephone Encounter (Signed)
Fax request received regarding a RF for Indocin but it is listed as "Historical" med.Per Darl Pikes Aliff "OK" to RF.Rx sent to the Pharmacy.

## 2014-06-21 NOTE — Progress Notes (Signed)
Patient notified

## 2014-07-05 ENCOUNTER — Ambulatory Visit
Admit: 2014-07-05 | Discharge: 2014-07-05 | Payer: PRIVATE HEALTH INSURANCE | Attending: Physician Assistant | Primary: Internal Medicine

## 2014-07-05 DIAGNOSIS — G629 Polyneuropathy, unspecified: Secondary | ICD-10-CM

## 2014-07-05 NOTE — Patient Instructions (Signed)
[facility]  Office working hours are Monday - Thursday 9:00 am to 5:00 pm.  Friday 8 am to 12 noon with limited staff.  Office phone number is [phone] and fax is [phone].  For non-emergent medical care and clinical advice during office hours:   1. Call office or   2.  Send message or request using MyChart  For non-emergent medical care and clinical advice after office hours:  1.  Call [phone] or  2.  Send message or request using MyChart  Emergency care can be obtained at the [facility] ER, Urgent Care or calling 911.    Patient Satisfaction Survey  We appreciate you giving us your e-mail address.  Please watch for our patient satisfaction survey which you will receive by e-mail.  We strive to provide you with the best care possible.  We respect all comments and will take comments into consideration to improve our service.  Thank you for your participation.  As a valued patient, you will be receiving a survey from [employer].  We encourage you to share your thoughts and opinions about the care you received today.  Thank you for choosing [facility].

## 2014-07-05 NOTE — Progress Notes (Signed)
Sedalia MutaSusan Dondi Burandt PA-C  [address], [address]  [place], [place] [id number]  Phone:  [phone]  Fax:  [phone]     Neurology Routine Follow-up    Patient ID  Name:  Rachael LipsSharon Jean Reisz  DOB:  1961-09-28  MRN:  [id number]  Age:  53 y.o.  PCP:  None    Subjective:     Encounter Date:  07/05/2014    Referring Physician: No ref. provider found    Chief Complaint   Patient presents with   ??? Follow-up     [facility]       History of Present Illness:   Rachael Brown is a 53 y.o.  female who returns for a follow up regarding test results     She is having imrovement of he pain with Elavil.  She states that she is resting better.  She was not able to tolerate a higher dose than 10 mg.      She has tried Neurontin, Cymbalta, Sevella, Lyrica. They all seem to cause edema and tunnel vision.     MRI:   There is disc degenerative changes with decreased disc signal throughout the lumbar spine. No significant disc bulging or protrusions. Mild facet arthropathy identified. Canal and neural foramen are maintained throughout.     EMG not done due to her scheduling issues.    No new complaints.      Current Outpatient Prescriptions   Medication Sig Dispense Refill   ??? indomethacin SR (INDOCIN SR) 75 mg SR capsule Take 1 Cap by mouth daily. 30 Cap 1   ??? tiZANidine (ZANAFLEX) 4 mg tablet Take 4 Tabs by mouth nightly. 360 Tab 1   ??? lidocaine (LIDODERM) 5 %(700 mg/patch) Apply patch to the affected area for 12 hours a day and remove for 12 hours a day. 3 Package 1   ??? promethazine (PHENERGAN) 6.25 mg/5 mL syrup   3   ??? amitriptyline (ELAVIL) 10 mg tablet 1 tab QHS for 1 week then 2 tabs QHS and then may go to 3 tabs QHS.  Indications: NEUROPATHIC PAIN 270 Tab 3   ??? diclofenac (VOLTAREN) 1 % topical gel Apply 4 g to affected area four (4) times daily. 100 g 1   ??? fluticasone (FLONASE) 50 mcg/actuation nasal spray      ??? aspirin 81 mg tablet Take 81 mg by mouth.      ??? Dexlansoprazole (DEXILANT) 60 mg CpDM Take  by mouth.       Allergies   Allergen Reactions   ??? Codeine Unknown (comments)   ??? Duloxetine Other (comments)     Side effects   ??? Percocet [Oxycodone-Acetaminophen] Unknown (comments)   ??? Pregabalin Other (comments)     Side effects from the medication     Patient Active Problem List   Diagnosis Code   ??? OA (osteoarthritis) M19.90   ??? DDD (degenerative disc disease) [id number]   ??? Carpal tunnel syndrome G56.00   ??? Routine gynecological examination Z01.419   ??? Menopause Z78.0   ??? Neuropathy (HCC) G62.9   ??? Bilateral low back pain with left-sided sciatica M54.42   ??? H/O cervical spine surgery Z98.89     Past Medical History   Diagnosis Date   ??? Osteoarthritis    ??? DDD (degenerative disc disease)    ??? Migraine    ??? Fibromyalgia    ??? OA (osteoarthritis)    ??? Carpal tunnel syndrome    ???  Menopause    ??? Neuropathy [facility])       Past Surgical History   Procedure Laterality Date   ??? Hx orthopaedic       CTR, Rotator cuff repair   ??? Hx heent       bilat eye   ??? Hx carpal tunnel release Right    ??? Pr anesth,surgery of shoulder Right      Due to osteoarthritis & rotator cuff tendonopathy.   ??? Hx dilation and curettage       & hysteroscopy due to uterine polyps      Family History   Problem Relation Age of Onset   ??? Arthritis-osteo Mother    ??? Heart Disease Mother    ??? Diabetes Mother    ??? Arthritis-rheumatoid Mother    ??? Hypertension Mother    ??? SLE Mother    ??? Coronary Artery Disease Mother    ??? Cancer Father    ??? Diabetes Father    ??? Hypertension Father    ??? Hypertension Sister    ??? Heart Disease Brother    ??? Hypertension Brother    ??? Diabetes Brother    ??? Coronary Artery Disease Brother    ??? Heart Disease Brother    ??? Hypertension Brother    ??? Diabetes Brother    ??? Coronary Artery Disease Brother       History     Social History   ??? Marital Status: MARRIED     Spouse Name: N/A     Number of Children: N/A   ??? Years of Education: N/A     Social History Main Topics    ??? Smoking status: Never Smoker    ??? Smokeless tobacco: Not on file   ??? Alcohol Use: Yes      Comment: social   ??? Drug Use: No   ??? Sexual Activity: Yes     Other Topics Concern   ??? Not on file     Social History Narrative        ROS     Constitutional: Negative for fever, chills and malaise/fatigue.   HENT: Negative for hearing loss and tinnitus.   Eyes: Negative for blurred vision, double vision and photophobia.   Respiratory: Negative for shortness of breath.   Cardiovascular: Negative for chest pain.   Gastrointestinal: Negative for nausea, vomiting and constipation.   Musculoskeletal: Positive for back pain, joint pain and neck pain. Negative for myalgias and falls.   Skin: Negative for rash.   Neurological: Positive for tingling and sensory change. Negative for dizziness, tremors, speech change, focal weakness, seizures, loss of consciousness, weakness and headaches.   Endo/Heme/Allergies: Does not bruise/bleed easily.   Psychiatric/Behavioral: Negative for depression and memory loss. The patient is not nervous/anxious and does not have insomnia.     Objective:     Filed Vitals:    07/05/14 1023   BP: 143/89   Pulse: 82   Height: 5\' 2"  (1.575 m)   Weight: 81.647 kg (180 lb)       Physical Exam  Constitutional: She is oriented to person, place, and time and well-developed, well-nourished, and in no distress.   HENT:   Head: Normocephalic and atraumatic.   Neck: Neck supple.   Pulmonary/Chest: Effort normal.   Musculoskeletal: Normal range of motion.   Neurological: She is alert and oriented to person, place, and time. She has normal sensation, normal strength and intact cranial nerves. She displays no weakness, no atrophy, no tremor  and facial symmetry. She exhibits normal muscle tone. She has an abnormal Romberg Test. She has a normal Straight Leg Raise Test, a normal Cerebellar Exam, a normal Finger-Nose-Finger Test, a normal Heel to [employer] and a normal Tandem  Gait Test. She shows no pronator drift. Coordination   Decreased sensation to PP and TEMP tdistal LE VIB is normal.     Impression:     Encounter Diagnoses     ICD-10-CM ICD-9-CM   1. Neuropathy (HCC) G62.9 355.9   2. Bilateral low back pain with left-sided sciatica M54.42 724.3   3. H/O cervical spine surgery Z98.89 V45.89   4. Fibromyalgia M79.7 729.1     Since she is feeling better with less pain in her feet with Elavil and her MRI was fairly normal, I do not feel we need to get the EMG at this point.     Plan:   No orders of the defined types were placed in this encounter.         Time Spent:  15 minutes  50% of face to face time today spent counseling patient regarding The primary encounter diagnosis was Neuropathy (HCC). Diagnoses of Bilateral low back pain with left-sided sciatica, H/O cervical spine surgery, and Fibromyalgia were also pertinent to this visit. and coordinating of care.      Follow-up Disposition:  Return in about 6 months (around 01/04/2015).  Signed By:  Wynonia Sours, PA-C     07/05/2014

## 2014-07-12 ENCOUNTER — Ambulatory Visit: Payer: BLUE CROSS/BLUE SHIELD | Primary: Internal Medicine

## 2014-07-12 MED ORDER — INDOMETHACIN SR 75 MG CAP
75 mg | ORAL_CAPSULE | Freq: Two times a day (BID) | ORAL | Status: DC
Start: 2014-07-12 — End: 2014-07-18

## 2014-07-12 NOTE — Telephone Encounter (Signed)
Pt called stating that her Indocin 75 mg BID  Rx went to the wrong Pharmacy. [facility] with dose adjustment "ok" per Sam. Pt states Darl PikesSusan increased her BID.

## 2014-07-18 MED ORDER — INDOMETHACIN SR 75 MG CAP
75 mg | ORAL_CAPSULE | Freq: Two times a day (BID) | ORAL | Status: DC
Start: 2014-07-18 — End: 2014-08-06

## 2014-07-18 NOTE — Telephone Encounter (Signed)
Jasmine DecemberSharon called in today stating that she must have a 90 prescription for her Endocin, not just 30.She also states she uses [facility].

## 2014-08-01 NOTE — Telephone Encounter (Signed)
Pt called stating that she needs an order [facility] for her Hip injection that is due.

## 2014-08-02 NOTE — Telephone Encounter (Signed)
Called placed to Pt to change appt time to 08/06/14 @ 3:40 with Sam.  Pt notified

## 2014-08-02 NOTE — Telephone Encounter (Signed)
Per Dr. Cheri GuppyFeinberg Pt needs an appt before orders can be faxed. Pt notified and appt was rescheduled from 08/22/14 from Susans schedule to 08/07/14 with Dr. Cheri GuppyFeinberg.Pt notified and is aware.

## 2014-08-06 ENCOUNTER — Ambulatory Visit: Admit: 2014-08-06 | Discharge: 2014-08-06 | Payer: PRIVATE HEALTH INSURANCE | Primary: Internal Medicine

## 2014-08-06 DIAGNOSIS — M150 Primary generalized (osteo)arthritis: Secondary | ICD-10-CM

## 2014-08-06 DIAGNOSIS — M159 Polyosteoarthritis, unspecified: Secondary | ICD-10-CM

## 2014-08-06 MED ORDER — DICLOFENAC 1 % TOPICAL GEL
1 % | Freq: Four times a day (QID) | CUTANEOUS | Status: DC
Start: 2014-08-06 — End: 2014-08-13

## 2014-08-06 MED ORDER — LIDOCAINE 5 % (700 MG/PATCH) ADHESIVE PATCH
5 % | PACK | CUTANEOUS | Status: DC
Start: 2014-08-06 — End: 2015-02-25

## 2014-08-06 MED ORDER — INDOMETHACIN SR 75 MG CAP
75 mg | ORAL_CAPSULE | Freq: Two times a day (BID) | ORAL | Status: DC
Start: 2014-08-06 — End: 2015-01-15

## 2014-08-06 MED ORDER — TIZANIDINE 4 MG TAB
4 mg | ORAL_TABLET | ORAL | Status: DC
Start: 2014-08-06 — End: 2015-04-10

## 2014-08-06 NOTE — Progress Notes (Signed)
[facility]  [address], [place], [place] [address]  Phone:  [phone](606) [phone]  Fax:  [phone]  Dimas AguasHoward L. Cheri GuppyFeinberg, D.O., F.A.C.O.I., F.A.C.R.  Pamalee LeydenSusan Aliff, MSN, APRN, FNP-BC  Curlene DolphinSamuel Namiyah Grantham, PA-C      Patient Name:  Rachael MayerSharon Jean Brown   D.O.B:  09-21-1961    Date of Service:  08/06/2014   PCP:  None    Subjective:    Patient seen with no new complaints.  She denies new joint pain or swelling.  She denies GI upset or side effects from medications.  Morning stiffness is lasting  10-15  Minutes. Doing back/quad exercise. Walking for aerobic exercise.   No new neurologic complaints.  She denies new skin changes.   She denies new medications or changes since last visit. Back is unchanged since last visit. No new symptoms.   Carpal tunnel syndrome-wrists are bothering her again now. She is wearing cock-up wrist splints at night.     Patient seen with report of continued fatigue - unchanged from last evaluation.  She denies functional problems or restrictions from the fatigue. She states that this does not require additional treatment at this time.  She denies chest pain, shortness of breath or GI upset.  Denies new cognitive impairment.      She denies new joint pain or swelling.  She reports continued generalized pain/tenderness consistent with Fibromyalgia.  Currently sleeping 5-6 uninterrupted hours per night.  Reports waking refreshed  in AM.  Reports doing walking for aerobic  Exercise..  Widespread pain index 8.  Severity scale 2 (Waking unrefreshed 0, Cognitive 0, fatigue 1, somatic 1).         Physical examination:  BP 128/90 mmHg   Pulse 98   Resp 16   Ht 5\' 2"  (1.575 m)   Wt 180 lb (81.647 kg)   BMI 32.91 kg/m2  Heart regular rate and rhythm with no rubs.    Lungs clear to auscultation with no rubs.    Joint -Tenderness to fibromyalgia trigger points (8/19). Severity scale-See above.     Spine: Cervical thoracic and lumbar spine have stable functional ROM with no     new deformity.  There is no scoliosis.  There is no change in the lordotic and    kyphotic curvatures.     Shoulder: Shoulders have stable active and passive ROM. There is no pain on    ROM testing.  There are no effusions or synovitis.  There is no warmth or   erythema.   Elbow: Elbow ROM is unchanged.  There is no active synovitis, effusion,    warmth, or erythema.     Wrist: Wrists have stable flexion and extension with no active synovitis or    effusion. Tenderness to anterior wrists to palpation bilaterally. Positive Tinel Sign bilaterally.   Finger: MCP and PIP joints have no active synovitis or effusion. DIP and first    CMC joints are unchanged.  There is no active synovitis or effusion of the DIP    and CMC joints.      Hip: Hips have stable ROM.  There is groin pain on internal rotation of the right hip. No tenderness to left hip on palpation or ROM.     Knees: Knee flexion and extension is unchanged.  There are no new palpable    deformities.  There are no effusions or active synovitis  There is no warmth or    erythema.   Ankle: Ankles have stable ROM with  no effusion or synovitis.    Toe: MTP and distal toe joints are unchanged.  There is no active synovitis,  warmth, erythema  or effusion.    Impressions:  Encounter Diagnoses     ICD-10-CM ICD-9-CM   1. Primary osteoarthritis involving multiple joints M15.0 715.09   2. Intervertebral disc disease M51.9 722.90   3. Neuropathy (HCC) G62.9 355.9   4. Fibromyalgia M79.7 729.1   5. Bilateral carpal tunnel syndrome G56.01 354.0    G56.02          Plan:  1. Continue current medications  2. 20 mg Depo Medrol with 1/2 ml 1 % Lidocaine injection of bilateral wrists. Alcohol prep. Patient sitting upon injection. Patient tolerated well.  Use of ice this P.M. For any injection site pain/soreness.  3. Tylenol Arthritis, muscle rubs, heating pad for additional joint pain relief as needed.   4. Continue Cock-Up wrist splints at night for relief of Carpal Tunnel  syndrome symptoms.    5. Referral to Interventional Radiology for right groin injection for relief of OA pain.   6. Treatment alternatives were discussed.  7. Lab for monitoring of disease and treatment  8. Prior lab were reviewed and reviewed with the patient  9. Current home physical therapy exercise program was reviewed.  10. Continue back/quad exercise.   11. Aerobic exercise to 5 times weekly at 30 minutes  12. Proper dosing of calcium and vitamin D were reviewed.  13. Call office if any new changes.   Orders Placed This Encounter   ??? METABOLIC PANEL, COMPREHENSIVE     Standing Status: Future      Number of Occurrences:       Standing Expiration Date: 02/02/2015   ??? POC COMPLETE CBC, AUTOMATED ENTER ([id number])   ??? diclofenac (VOLTAREN) 1 % topical gel     Sig: Apply 4 g to affected area four (4) times daily.     Dispense:  100 g     Refill:  2   ??? indomethacin SR (INDOCIN SR) 75 mg SR capsule     Sig: Take 1 Cap by mouth two (2) times a day.     Dispense:  180 Cap     Refill:  0   ??? lidocaine (LIDODERM) 5 %(700 mg/patch)     Sig: Apply patch to the affected area for 12 hours a day and remove for 12 hours a day.     Dispense:  3 Package     Refill:  1   ??? tiZANidine (ZANAFLEX) 4 mg tablet     Sig: Take 1 pill BID during day, and then take 4 pills PO qhs.     Dispense:  540 Tab     Refill:  1     Follow-up Disposition:  Return in about 3 months (around 11/06/2014) for Follow-up.      Lilia ArgueSamuel I Tayshawn Purnell, PA

## 2014-08-07 ENCOUNTER — Inpatient Hospital Stay: Admit: 2014-08-07 | Payer: BLUE CROSS/BLUE SHIELD | Primary: Internal Medicine

## 2014-08-07 LAB — METABOLIC PANEL, COMPREHENSIVE
A-G Ratio: 1.3 (ref 1.2–2.2)
ALT (SGPT): 51 U/L (ref 12–78)
AST (SGOT): 22 U/L (ref 15–37)
Albumin: 4.3 g/dL (ref 3.4–5.0)
Alk. phosphatase: 68 U/L (ref 45–117)
Anion gap: 14 mmol/L (ref 6–15)
BUN/Creatinine ratio: 28 — ABNORMAL HIGH (ref 7–25)
BUN: 25 MG/DL — ABNORMAL HIGH (ref 7–18)
Bilirubin, total: 0.4 MG/DL (ref <1.1)
CO2: 22 mmol/L (ref 21–32)
Calcium: 9.4 MG/DL (ref 8.5–10.1)
Chloride: 107 mmol/L (ref 98–107)
Creatinine: 0.9 MG/DL (ref 0.60–1.30)
GFR est AA: >60 mL/min/{1.73_m2} (ref >60)
GFR est non-AA: >60 mL/min/{1.73_m2} (ref >60)
Globulin: 3.3 g/dL (ref 2.4–3.5)
Glucose: 91 mg/dL (ref 70–110)
Potassium: 4.2 mmol/L (ref 3.5–5.3)
Protein, total: 7.6 g/dL (ref 6.4–8.2)
Sodium: 143 mmol/L (ref 136–145)

## 2014-08-07 NOTE — Progress Notes (Signed)
[facility] Interventional Radiology [phone] [facility] to [phone]. Patient will be scheduled for injection.

## 2014-08-08 LAB — AMB POC COMPLETE CBC,AUTOMATED ENTER
ABS. LYMPHS (POC): 2.3 10*3/uL (ref 0.6–4.1)
HCT (POC): 40.6 % (ref 37–51)
HGB (POC): 12.6 g/dL (ref 12–18)
LYMPHOCYTES (POC): 29.7 % (ref 10–58.5)
MCH (POC): 27.5 pg (ref 26–32)
MCHC (POC): 31 g/dL (ref 31–36)
MCV (POC): 88.5 fL (ref 80–97)
PLATELET (POC): 266 10*3/uL (ref 140–440)
RBC (POC): 4.59 M/uL (ref 4.2–6.3)
WBC (POC): 7.6 10*3/uL (ref 4.1–10.9)

## 2014-08-09 NOTE — Progress Notes (Signed)
[facility] scheduled [facility] 08/10/14 12:30pm. Patient aware.

## 2014-08-13 ENCOUNTER — Telehealth

## 2014-08-13 MED ORDER — DICLOFENAC 1 % TOPICAL GEL
1 % | Freq: Four times a day (QID) | CUTANEOUS | Status: DC
Start: 2014-08-13 — End: 2014-08-27

## 2014-08-13 NOTE — Telephone Encounter (Signed)
Pt called to say that she only received 1 tube of Voltaren Gel when she usually gets 6 tube on a 90 day order. Rx resent for 400 grams for a 90 day supply with 1 RF

## 2014-08-22 ENCOUNTER — Encounter: Attending: Family | Primary: Internal Medicine

## 2014-08-27 ENCOUNTER — Encounter

## 2014-08-27 MED ORDER — DICLOFENAC 1 % TOPICAL GEL
1 % | Freq: Four times a day (QID) | CUTANEOUS | Status: DC
Start: 2014-08-27 — End: 2015-02-25

## 2014-10-04 ENCOUNTER — Encounter: Primary: Internal Medicine

## 2014-10-04 ENCOUNTER — Ambulatory Visit: Admit: 2014-10-04 | Discharge: 2014-10-04 | Payer: PRIVATE HEALTH INSURANCE | Primary: Internal Medicine

## 2014-10-04 DIAGNOSIS — M159 Polyosteoarthritis, unspecified: Secondary | ICD-10-CM

## 2014-10-04 NOTE — Progress Notes (Signed)
[facility]  [address], [place], [place] [address]  Phone:  [phone]  Fax:  [phone]  Dimas AguasHoward L. Cheri GuppyFeinberg, D.O., F.A.C.O.I., F.A.C.R.  Curlene DolphinSamuel Amirr Achord, PA-C      Patient Name:  Rachael Brown   D.O.B:  [birth year]    Date of Service:  10/04/2014   PCP:  None    Subjective:    Patient seen with no new complaints.  She denies new joint pain or swelling.  She denies GI upset or side effects from medications.  Morning stiffness is lasting  20-30  minutes. Doing back/quad exercise. Walking for aerobic exercise. She states she is having times where her right hip will hurt, and the pain will radiate down the side of her leg to her right knee. She states if she sits and raises leg in a recliner, and using Voltaren gel helps to relieve.  No new neurologic complaints.  She denies new skin changes.   She denies new medications or changes since last visit. She states [place].    Patient seen with report of continued fatigue - unchanged from last evaluation.  She denies functional problems or restrictions from the fatigue. She states that this does not require additional treatment at this time.  She denies chest pain, shortness of breath or GI upset.  Denies new cognitive impairment.      She denies new joint pain or swelling.  She reports continued generalized pain/tenderness consistent with Fibromyalgia.  Currently sleeping 4-5 uninterrupted hours per night. She states sleep has been less since she has had the back pain. It wakes her up at night.  Reports waking fatigued in AM.  Reports doing walking for exercise.  Widespread pain index 10.  Severity scale 5 (Waking unrefreshed 2, Cognitive 0, fatigue 1, somatic 2).   Carpal Tunnel-Using Cock-up wrist splints at night. Not hurting bad enough for injections yet. Stable.         Physical examination:  BP 160/96 mmHg   Pulse 82   Resp 16   Ht 5\' 2"  (1.575 m)   Wt 180 lb (81.647 kg)   BMI 32.91 kg/m2   Heart regular rate and rhythm with no rubs.    Lungs clear to auscultation with no rubs.    Joint -Tenderness to fibromyalgia trigger points (10/19). Severity scale-See above.     Spine: Cervical thoracic and lumbar spine have stable functional ROM with no    new deformity.  There is no scoliosis.  There is no change in the lordotic and    kyphotic curvatures.     Shoulder: Shoulders have stable active and passive ROM. There is no pain on    ROM testing.  There are no effusions or synovitis.  There is no warmth or   erythema.   Elbow: Elbow ROM is unchanged.  There is no active synovitis, effusion,    warmth, or erythema.     Wrist: Wrists have stable flexion and extension with no active synovitis or    effusion.    Finger: MCP and PIP joints have no active synovitis or effusion. DIP and first    CMC joints are unchanged.  There is no active synovitis or effusion of the DIP    and CMC joints.      Hip: Hips have stable ROM.  There is tenderness to right posterior hip on palpation. No pain on ROM.     Knees: Knee flexion and extension  is unchanged.  There are no new palpable    deformities.  There are no effusions or active synovitis  There is no warmth or    erythema.   Ankle: Ankles have stable ROM with no effusion or synovitis.    Toe: MTP and distal toe joints are unchanged.  There is no active synovitis,  warmth, erythema  or effusion.    Impressions:  Encounter Diagnoses     ICD-10-CM ICD-9-CM   1. Primary osteoarthritis involving multiple joints M15.0 715.09   2. Intervertebral disc disease M51.9 722.90   3. Bilateral carpal tunnel syndrome G56.01 354.0    G56.02    4. Fibromyalgia M79.7 729.1   5. Sciatica, right M54.31 724.3         Plan:  1. Continue current medications  2. 40 mg Solu-Medrol injection of right posterior hip given by nurse.   3. Aspercreme with Lidocaine, heating pad, hot shower for additional right posterior tenderness relief as needed.   4. Treatment alternatives were discussed.   5. Prior lab were reviewed and reviewed with the patient  6. Current home physical therapy exercise program was reviewed.  7. Continue back/quad exercise.  8. Aerobic exercise to 5 times weekly at 30 minutes.   9. Rachael Brown was given detailed instructions in a home physical therapy exercise program.  10. Proper dosing of calcium and vitamin D were reviewed.  11. Call office if any new changes.   No orders of the defined types were placed in this encounter.     Follow-up Disposition:  Return for Keep scheduled appt. Rachael Brown.      Syvilla Martin I Emanual Lamountain, PA

## 2014-10-16 ENCOUNTER — Encounter

## 2014-10-30 ENCOUNTER — Encounter: Attending: Obstetrics & Gynecology | Primary: Internal Medicine

## 2014-12-04 NOTE — Telephone Encounter (Signed)
Rachael DecemberSharon called asking for an appointment for an injection in her hip sometime in April.Her last injection was 10/04/14.I called to see if April 19 at 2 pm would work for her.No answer.

## 2014-12-05 NOTE — Telephone Encounter (Signed)
Patient called to see if she could get an order for her hip injection for the same day as her follow up visit,    Per Sam during her appointment we can give her one.    I spoke with patient and she states no I always have [facility].    Per Dr Cheri GuppyFeinberg we will need to exam patient at her appointment, if she does need an injection, he could give her one or send an order to the hospital    lmom

## 2015-01-07 ENCOUNTER — Inpatient Hospital Stay: Admit: 2015-01-07 | Payer: BLUE CROSS/BLUE SHIELD | Attending: Obstetrics & Gynecology | Primary: Internal Medicine

## 2015-01-07 ENCOUNTER — Ambulatory Visit: Admit: 2015-01-07 | Discharge: 2015-01-07 | Payer: PRIVATE HEALTH INSURANCE | Primary: Internal Medicine

## 2015-01-07 DIAGNOSIS — M159 Polyosteoarthritis, unspecified: Secondary | ICD-10-CM

## 2015-01-07 DIAGNOSIS — Z1231 Encounter for screening mammogram for malignant neoplasm of breast: Secondary | ICD-10-CM

## 2015-01-07 MED ORDER — METHYLPREDNISOLONE 20 MG/ML SUSP FOR INJECTION
20 mg/mL | Freq: Once | INTRAMUSCULAR | Status: AC
Start: 2015-01-07 — End: 2015-01-07

## 2015-01-07 NOTE — Progress Notes (Signed)
[facility]  [address], [place], [place] [address]  Phone:  [phone]  Fax:  [phone]  Dimas Aguas L. Cheri Guppy, D.O., F.A.C.O.I., F.A.C.R.  Curlene Dolphin, PA-C      Patient Name:  Rachael Brown.O.B:  10-03-1961    Date of Service:  01/07/2015   PCP:  None    Subjective:    Patient seen with no new complaints.  She denies new joint pain or swelling.  She denies GI upset or side effects from medications.  Morning stiffness is lasting  20-30  Minutes. Doing back/quad exercise. Doing recumbent bike in pool for aerobic exercise.  No new neurologic complaints.  She denies new skin changes.   She denies new medications or changes since last visit. She states that right groin has been hurting. She states she has had injections in the hip before, and that injection will help for several months.     Patient seen with report of continued fatigue - unchanged from last evaluation.  She denies functional problems or restrictions from the fatigue. She states that this does not require additional treatment at this time.  She denies chest pain, shortness of breath or GI upset.  Denies new cognitive impairment.      She denies new joint pain or swelling.  She reports continued generalized pain/tenderness consistent with Fibromyalgia.  Currently sleeping 4-5 uninterrupted hours per night.  Reports waking fatigued in AM.  Aerobic exercise on occaision. She states sleep is better when she has injection in hip,  Widespread pain index 8.  Severity scale 4 (Waking unrefreshed 1, Cognitive 1, fatigue 1, somatic 1).   Carpal Tunnel- She states that wrists are painful enough  For injections today. Using Cock-up wrist splints at night.         Physical examination:  BP 142/95 mmHg   Pulse 75   Resp 16   Wt 175 lb 12.8 oz (79.742 kg)  Heart regular rate and rhythm with no rubs.    Lungs clear to auscultation with no rubs.    Joint - Tenderness to fibromyalgia trigger points (8/19). Severity scale-See above.    Spine: Cervical thoracic and lumbar spine have stable functional ROM with no    new deformity.  There is no scoliosis.  There is no change in the lordotic and    kyphotic curvatures.     Shoulder: Shoulders have stable active and passive ROM. There is no pain on    ROM testing.  There are no effusions or synovitis.  There is no warmth or   erythema.   Elbow: Elbow ROM is unchanged.  There is no active synovitis, effusion,    warmth, or erythema.     Wrist: Wrists have stable flexion and extension with no active synovitis or    effusion. Tenderness to anterior wrist palpation bilaterally. Positive Tinel sign bilaterally.    Finger: MCP and PIP joints have no active synovitis or effusion. DIP and first    CMC joints are unchanged.  There is no active synovitis or effusion of the DIP    and CMC joints.      Hip: Hips have stable ROM.  There is groin pain on internal ROM of right hip.  No pain on palpation..     Knees: Knee flexion and extension is unchanged.  There are no new palpable    deformities. Mild tenderness to medial/lateral knee palpation bilaterally.  There are no effusions or active synovitis  There is  no warmth or    erythema.   Ankle: Ankles have stable ROM with no effusion or synovitis.    Toe: MTP and distal toe joints are unchanged.  There is no active synovitis,  warmth, erythema  or effusion.    Impressions:  Encounter Diagnoses     ICD-10-CM ICD-9-CM   1. Primary osteoarthritis involving multiple joints M15.0 715.09   2. Intervertebral disc disease M51.9 722.90   3. Bilateral carpal tunnel syndrome G56.01 354.0    G56.02    4. Fibromyalgia M79.7 729.1   5. Right hip pain M25.551 719.45         Plan:  1. Continue current medications  2. Aspercreme with Lidocaine, heating pad for additional joint pain relief as needed.   3. 20 mg DepO Medrol with 1/2 ml 1% Lidocaine injection of bilateral wrists. Alcohol prep. Patient sitting with wrist resting on exam table  during injections.  Patient tolerated well. Use of ice this P.M. For any injection site pain/soreness.   4. Referral to interventional radiology for right groin injection. Patient [facility].  5. Treatment alternatives were discussed.  6. Prior lab were reviewed and reviewed with the patient  7. Current home physical therapy exercise program was reviewed.  8. Continue back/quad exercise.   9. Aerobic exercise to 5 times weekly at 30 minutes.   10. Proper dosing of calcium and vitamin D were reviewed.  11. Call office if any new changes.   Orders Placed This Encounter   ??? REFERRAL TO INTERVENTIONAL RADIOLOGY     Referral Priority:  Routine     Referral Type:  Consultation     Referral Reason:  Specialty Services Required     Follow-up Disposition:  Return in about 3 months (around 04/08/2015) for Follow-up.      Lilia ArgueSamuel I Madailein Londo, PA

## 2015-01-07 NOTE — Addendum Note (Signed)
Addended by: Darden PalmerOBINSON, Imberly Troxler on: 01/07/2015 01:54 PM      Modules accepted: Orders, SmartSet

## 2015-01-14 ENCOUNTER — Ambulatory Visit
Admit: 2015-01-14 | Discharge: 2015-01-14 | Payer: PRIVATE HEALTH INSURANCE | Attending: Physician Assistant | Primary: Internal Medicine

## 2015-01-14 DIAGNOSIS — G629 Polyneuropathy, unspecified: Secondary | ICD-10-CM

## 2015-01-14 MED ORDER — BACLOFEN 10 MG TAB
10 mg | ORAL_TABLET | ORAL | Status: DC
Start: 2015-01-14 — End: 2015-05-08

## 2015-01-14 NOTE — Progress Notes (Signed)
Sedalia Muta PA-C  [address], [address]  [place], [place] [address]  Phone:  [phone]  Fax:  [phone]     Neurology Routine Follow-up    Patient ID  Name:  Rachael Brown  DOB:  1960-11-21  MRN:  [id number]  Age:  54 y.o.  PCP:  None    Subjective:     Encounter Date:  01/14/2015    Referring Physician: No ref. provider found    Chief Complaint   Patient presents with   ??? Neuropathy     6 month f/u, patient states feet/legs are still very painful       History of Present Illness:   Rachael Brown is a 54 y.o.  female who returns for a follow up regarding neuropathy.  She had noticed an increase in there pain especially at night over the last 2-3 months.  The Elavil does not seem to be helping as well and she cannot tolerate an increase in the dose.    She is only sleeping 4 hours a night.  She also had an incident 4 weeks ago of pain on the dorsum of her left foot and then developed a bruise and swelling.  There was no injury.      She has tried Neurontin, Cymbalta, Sevella, Lyrica. They all seem to cause edema and tunnel vision.     Dr. Cheri Guppy has her on Zanaflex 16 mg at night and  BID.  She is still having muscle spasms at night.      4 weeks ago  She had a  pain in the top of her left foot.  Soon after, she developed a bruise.  She was not able to wear shoes.  No known injury.        Current Outpatient Prescriptions   Medication Sig Dispense Refill   ??? baclofen (LIORESAL) 10 mg tablet 1-2 tabs at bedtime 60 Tab 5   ??? diclofenac (VOLTAREN) 1 % topical gel Apply 4 g to affected area four (4) times daily. Dispense 5 tubes,100 gram each,apply 4 grams QID 1500 g 1   ??? pyridoxine (VITAMIN B-6) 100 mg tablet Take 100 mg by mouth daily.     ??? indomethacin SR (INDOCIN SR) 75 mg SR capsule Take 1 Cap by mouth two (2) times a day. 180 Cap 0   ??? lidocaine (LIDODERM) 5 %(700 mg/patch) Apply patch to the affected area for 12 hours a day and remove for 12 hours a day. 3 Package 1    ??? tiZANidine (ZANAFLEX) 4 mg tablet Take 1 pill BID during day, and then take 4 pills PO qhs. 540 Tab 1   ??? amitriptyline (ELAVIL) 10 mg tablet 1 tab QHS for 1 week then 2 tabs QHS and then may go to 3 tabs QHS.  Indications: NEUROPATHIC PAIN 270 Tab 3   ??? fluticasone (FLONASE) 50 mcg/actuation nasal spray      ??? aspirin 81 mg tablet Take 81 mg by mouth.     ??? Dexlansoprazole (DEXILANT) 60 mg CpDM Take  by mouth.       Allergies   Allergen Reactions   ??? Codeine Unknown (comments)   ??? Duloxetine Other (comments)     Side effects   ??? Percocet [Oxycodone-Acetaminophen] Unknown (comments)   ??? Pregabalin Other (comments)     Side effects from the medication   ??? Savella [Milnacipran] Other (comments)     Patient Active Problem List   Diagnosis Code   ???  OA (osteoarthritis) M19.90   ??? DDD (degenerative disc disease) IMO0002   ??? Carpal tunnel syndrome G56.00   ??? Routine gynecological examination Z01.419   ??? Menopause Z78.0   ??? Neuropathy G62.9   ??? Bilateral low back pain with left-sided sciatica M54.42   ??? H/O cervical spine surgery Z98.89   ??? Intervertebral disc disease M51.9   ??? Bilateral carpal tunnel syndrome G56.01, G56.02   ??? Fibromyalgia M79.7     Past Medical History   Diagnosis Date   ??? Osteoarthritis    ??? DDD (degenerative disc disease)    ??? Migraine    ??? Fibromyalgia    ??? OA (osteoarthritis)    ??? Carpal tunnel syndrome    ??? Menopause    ??? Neuropathy    ??? Intervertebral disc disease 08/06/2014   ??? Bilateral carpal tunnel syndrome 08/06/2014   ??? Fibromyalgia 10/04/2014      Past Surgical History   Procedure Laterality Date   ??? Hx orthopaedic       CTR, Rotator cuff repair   ??? Hx heent       bilat eye   ??? Hx carpal tunnel release Right    ??? Pr anesth,surgery of shoulder Right      Due to osteoarthritis & rotator cuff tendonopathy.   ??? Hx dilation and curettage       & hysteroscopy due to uterine polyps      Family History   Problem Relation Age of Onset   ??? Arthritis-osteo Mother    ??? Heart Disease Mother     ??? Diabetes Mother    ??? Arthritis-rheumatoid Mother    ??? Hypertension Mother    ??? SLE Mother    ??? Coronary Artery Disease Mother    ??? Cancer Father    ??? Diabetes Father    ??? Hypertension Father    ??? Hypertension Sister    ??? Heart Disease Brother    ??? Hypertension Brother    ??? Diabetes Brother    ??? Coronary Artery Disease Brother    ??? Heart Disease Brother    ??? Hypertension Brother    ??? Diabetes Brother    ??? Coronary Artery Disease Brother       History     Social History   ??? Marital Status: MARRIED     Spouse Name: N/A   ??? Number of Children: N/A   ??? Years of Education: N/A     Social History Main Topics   ??? Smoking status: Never Smoker    ??? Smokeless tobacco: Not on file   ??? Alcohol Use: Yes      Comment: social   ??? Drug Use: No   ??? Sexual Activity: Yes     Other Topics Concern   ??? None     Social History Narrative        ROS     Constitutional: Negative for fever, chills and malaise/fatigue.   HENT: Negative for hearing loss and tinnitus.   Eyes: Negative for blurred vision, double vision and photophobia.   Respiratory: Negative for shortness of breath.   Cardiovascular: Negative for chest pain.   Gastrointestinal: Negative for nausea, vomiting and constipation.   Musculoskeletal: Positive for back pain, joint pain and neck pain. Negative for myalgias and falls.   Skin: Negative for rash.   Neurological: Positive for tingling and sensory change. Negative for dizziness, tremors, speech change, focal weakness, seizures, loss of consciousness, weakness and headaches.   Endo/Heme/Allergies: Does not  bruise/bleed easily.   Psychiatric/Behavioral: Negative for depression and memory loss. The patient is not nervous/anxious and does not have insomnia.     Objective:     Filed Vitals:    01/14/15 1051   BP: 157/110   Pulse: 76   Height: 5\' 2"  (1.575 m)   Weight: 78.926 kg (174 lb)       Physical Exam  Constitutional: She is oriented to person, place, and time and well-developed, well-nourished, and in no distress.   HENT:    Head: Normocephalic and atraumatic.   Neck: Neck supple.   Pulmonary/Chest: Effort normal.   Musculoskeletal: Normal range of motion.   Neurological: She is alert and oriented to person, place, and time. She has normal sensation, normal strength and intact cranial nerves. She displays no weakness, no atrophy, no tremor and facial symmetry. She exhibits normal muscle tone. She has an abnormal Romberg Test. She has a normal Straight Leg Raise Test, a normal Cerebellar Exam, a normal Finger-Nose-Finger Test, a normal Heel to [employer] Test and a normal Tandem Gait Test. She shows no pronator drift. Coordination   Decreased sensation to PP and TEMP distal LE VIB is slightly decreased and more significant than her last visit.       Impression:     Encounter Diagnoses     ICD-10-CM ICD-9-CM   1. Neuropathy G62.9 355.9   2. Bilateral low back pain with left-sided sciatica M54.42 724.3   3. H/O cervical spine surgery Z98.89 V45.89   4. Fibromyalgia M79.7 729.1   5. 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  There has not been a cause for he symptoms found so far and she has had side effects of all the medications to treat her symptoms.    I am going to refer her for a small fiber punch biopsy.    Will try her with baclofen at night along with 8 mg of Zanaflex (instead of 16mg ) To see if she will have better sleep and less muscle spasms at night.       Plan:     Orders Placed This Encounter   ??? REFERRAL TO PODIATRY     Referral Priority:  Routine     Referral Type:  Consultation     Referral Reason:  Specialty Services Required     Referral Location:  [facility]     Referred to Provider:  Phylliss BobPhillip R Dowdy Jr., DPM   ??? baclofen (LIORESAL) 10 mg tablet     Sig: 1-2 tabs at bedtime     Dispense:  60 Tab     Refill:  5         Time Spent:  15 minutes  50% of face to face time today spent counseling patient regarding The  primary encounter diagnosis was Neuropathy. Diagnoses of Bilateral low back pain with left-sided sciatica, H/O cervical spine surgery, Fibromyalgia, and Muscle spasms of both lower extremities were also pertinent to this visit. and coordinating of care.      Follow-up Disposition:  Return for AFTER TESTING.  Signed By:  Wynonia SoursSusan M Syrai Gladwin, PA-C     01/14/2015

## 2015-01-14 NOTE — Patient Instructions (Signed)
As a valued patient, you will be receiving a survey from [employer].  We encourage you to share your thoughts and opinions about the care you received today.  Thank you for choosing [facility].  [facility]  Office working hours are Monday - Thursday 9:00 am to 5:00 pm.  Friday 8 am to 12 noon with limited staff.  Office phone number is [phone] and fax is [phone].  For non-emergent medical care and clinical advice during office hours:   1. Call office or   2.  Send message or request using MyChart  For non-emergent medical care and clinical advice after office hours:  1.  Call [phone] or  2.  Send message or request using MyChart  Emergency care can be obtained at the [facility] ER, Urgent Care or calling 911.    Patient Satisfaction Survey  We appreciate you giving us your e-mail address.  Please watch for our patient satisfaction survey which you will receive by e-mail.  We strive to provide you with the best care possible.  We respect all comments and will take comments into consideration to improve our service.  Thank you for your participation.

## 2015-01-15 MED ORDER — INDOMETHACIN SR 75 MG CAP
75 mg | ORAL_CAPSULE | ORAL | Status: DC
Start: 2015-01-15 — End: 2015-04-10

## 2015-01-15 NOTE — Telephone Encounter (Signed)
OK to fill this prescription.

## 2015-01-21 ENCOUNTER — Encounter: Attending: Physician Assistant | Primary: Internal Medicine

## 2015-01-22 ENCOUNTER — Ambulatory Visit
Admit: 2015-01-22 | Discharge: 2015-01-22 | Payer: PRIVATE HEALTH INSURANCE | Attending: Family | Primary: Internal Medicine

## 2015-01-22 ENCOUNTER — Inpatient Hospital Stay: Admit: 2015-01-22 | Payer: BLUE CROSS/BLUE SHIELD | Primary: Internal Medicine

## 2015-01-22 ENCOUNTER — Encounter: Attending: Obstetrics & Gynecology | Primary: Internal Medicine

## 2015-01-22 DIAGNOSIS — Z01419 Encounter for gynecological examination (general) (routine) without abnormal findings: Secondary | ICD-10-CM

## 2015-01-22 LAB — AMB POC FECAL BLOOD, OCCULT, QL 3 CARDS

## 2015-01-22 NOTE — Addendum Note (Signed)
Addended by: Crista LuriaSORRELL, Betsabe Iglesia L on: 01/22/2015 12:39 PM      Modules accepted: Level of Service

## 2015-01-22 NOTE — Progress Notes (Signed)
Rachael Brown         Phone:  [phone]         Fax:  [phone]    Patient:  Rachael Brown DOB:  09-09-1961  MRN:  [id number]  D.O.S.:  01/22/2015    History of Present Illness:  Rachael Brown, a 54 y.o. female who is here today for   Chief Complaint   Patient presents with   ??? Annual Exam        54 y/o female presents for annual exam. No current concerns or complaints. Mammogram performed on 01/07/2015, WNL. Last colonoscopy 2015.     No LMP recorded. Patient is postmenopausal.   Allergies   Allergen Reactions   ??? Codeine Unknown (comments)   ??? Duloxetine Other (comments)     Side effects   ??? Percocet [Oxycodone-Acetaminophen] Unknown (comments)   ??? Pregabalin Other (comments)     Side effects from the medication   ??? Savella [Milnacipran] Other (comments)     Current Outpatient Prescriptions   Medication Sig Dispense Refill   ??? indomethacin SR (INDOCIN SR) 75 mg SR capsule TAKE 1 CAPSULE TWICE A DAY 180 Cap 1   ??? baclofen (LIORESAL) 10 mg tablet 1-2 tabs at bedtime 60 Tab 5   ??? diclofenac (VOLTAREN) 1 % topical gel Apply 4 g to affected area four (4) times daily. Dispense 5 tubes,100 gram each,apply 4 grams QID 1500 g 1   ??? pyridoxine (VITAMIN B-6) 100 mg tablet Take 100 mg by mouth daily.     ??? lidocaine (LIDODERM) 5 %(700 mg/patch) Apply patch to the affected area for 12 hours a day and remove for 12 hours a day. 3 Package 1   ??? tiZANidine (ZANAFLEX) 4 mg tablet Take 1 pill BID during day, and then take 4 pills PO qhs. 540 Tab 1   ??? amitriptyline (ELAVIL) 10 mg tablet 1 tab QHS for 1 week then 2 tabs QHS and then may go to 3 tabs QHS.  Indications: NEUROPATHIC PAIN 270 Tab 3   ??? fluticasone (FLONASE) 50 mcg/actuation nasal spray      ??? aspirin 81 mg tablet Take 81 mg by mouth.     ??? Dexlansoprazole (DEXILANT) 60 mg CpDM Take  by mouth.       Filed Vitals:    01/22/15 0921   BP: 122/72   Pulse: 76   Resp: 16   Height:  (1.575 m)   Weight: 173 lb 8 oz (78.699 kg)    PainSc:   0 - No pain     Body mass index is 31.73 kg/(m^2).    Menstrual History  Comments: Post-menopausal    Contraception  Current form of contraception: None  Previous forms of contracpetion: None         Mammogram/Pap  Mammogram Date: 01/07/15  Mammogram Results: Negative per pt.  Pap Date: 10/26/13  Pap Results: Negative    STD History  STD History: None    OB History     No data available         Primary Brown Provider:  None   Referred to [facility] by:  No ref. provider found   Preferred Hospital:  [facility]     Breast Self-Exam:  YES    Review of Systems:   General:  no fever, chills, or body aches   Skin:  no hives, itching or change in skin color   Eyes:  no blurred vision, eye pain, or discharge  Ears:  no ear pain, discharge, or difficulty hearing   Nose: no nasal congestion, discharge, or bleeding   Neck:  no pain or swelling   Respiratory:  no shortness of breath, cough, wheezing, or bloody sputum   Cardiac:  no chest pains or palpitations   G/I:  no nausea, vomiting, diarrhea, constipation, or abdominal pain   Musculo-Skeletal:  no joint or muscle pain, no back pain   G/U:  no dysuria, frequency of urination, or slow stream   Neuro:  no slurred speech, seizures, or localized weakness   Other Systems:  WNL    Past Medical History   Diagnosis Date   ??? Osteoarthritis    ??? DDD (degenerative disc disease)    ??? Migraine    ??? Fibromyalgia    ??? OA (osteoarthritis)    ??? Carpal tunnel syndrome    ??? Menopause    ??? Neuropathy    ??? Intervertebral disc disease 08/06/2014   ??? Bilateral carpal tunnel syndrome 08/06/2014   ??? Fibromyalgia 10/04/2014     Past Surgical History   Procedure Laterality Date   ??? Hx orthopaedic       CTR, Rotator cuff repair   ??? Hx heent       bilat eye   ??? Hx carpal tunnel release Right    ??? Pr anesth,surgery of shoulder Right      Due to osteoarthritis & rotator cuff tendonopathy.   ??? Hx dilation and curettage       & hysteroscopy due to uterine polyps     Family History    Problem Relation Age of Onset   ??? Arthritis-osteo Mother    ??? Heart Disease Mother    ??? Diabetes Mother    ??? Arthritis-rheumatoid Mother    ??? Hypertension Mother    ??? SLE Mother    ??? Coronary Artery Disease Mother    ??? Cancer Father    ??? Diabetes Father    ??? Hypertension Father    ??? Hypertension Sister    ??? Heart Disease Brother    ??? Hypertension Brother    ??? Diabetes Brother    ??? Coronary Artery Disease Brother    ??? Heart Disease Brother    ??? Hypertension Brother    ??? Diabetes Brother    ??? Coronary Artery Disease Brother      History     Social History   ??? Marital Status: MARRIED     Spouse Name: N/A   ??? Number of Children: N/A   ??? Years of Education: N/A     Social History Main Topics   ??? Smoking status: Never Smoker    ??? Smokeless tobacco: Not on file   ??? Alcohol Use: 0.0 oz/week     0 Standard drinks or equivalent per week      Comment: social   ??? Drug Use: No   ??? Sexual Activity: Yes     Other Topics Concern   ??? None     Social History Narrative     Physical Examination:   General appearance - alert, well appearing, and in no distress   Skin - normal coloration and turgor, no rashes   Mental status - alert, oriented to person, place, and time   HEENT - NC, AT   Neck - supple, no thyromegaly   Lungs - clear to auscultation, no wheezes, rales or rhonchi   Breasts - no adenopathy, dimpling, or skin retractions noted, no discharge and non-tender  Heart - normal rate, regular rhythm, normal S1, S2, no murmurs, rubs, clicks or gallops   Abdomen - soft, non-tender, + BS, negative hepatosplenomegaly   Extremities - without clubbing, cyanosis or edema   Neurological - alert, oriented x3   Musculoskeletal - no joint tenderness, deformity or swelling   Pelvic -     EGBUS: WNL    VAGINA: pink and rugated    CERVIX: without lesion    UTERUS: AV, NSSC, [place], NT    ADNEXA: non-tender, no masses   Rectal - negative guaiac test   Pelvis - WNL   Inguinal Lymph Nodes - negative    Diagnostic Test Results:     Fecal occult blood negative    Decision Making (PLAN):    ICD-10-CM ICD-9-CM    1. Encounter for gynecological examination (general) (routine) without abnormal findings Z01.419 V72.31 PAP (IMAGE GUIDED) + HPV HIGH RISK   2. History of screening mammography Z92.89 V15.89    3. Screening for malignant neoplasm of the rectum Z12.12 V76.41 AMB POC FECAL BLOOD, OCCULT, QL 3 CARDS   4. Menopause Z78.0 627.2      Orders Placed This Encounter   ??? AMB FECAL OCCULT BLOOD QL-3 CARDS   ??? PAP (IMAGE GUIDED) + HPV HIGH RISK       Other Notes:  Routine exam/Pap performed on 54 y/o female. No voiced concerns or complaints. Denies leakage of urine even with cough or sneezing. Colonoscopy 1 year ago negative per pt report.   -Routine GYN exam with PAP, follow up in 1 year  -Mammogram completed for this year. Repeat in 1 year  -Colonoscopy up to date ( last one in 2015) Follow up for next screening as per GI recommendations. Rectal exam today negative for occult blood  -Denies significant menopausal symptoms.   -follow up in 1 year or as needed for new concerns or problems  -10 minutes were spent with patient with at least 50% involving counseling    Follow-up Disposition:  Return in about 1 year (around 01/22/2016).  Crista Luria, NP  01/22/2015 10:30 AM

## 2015-01-22 NOTE — Patient Instructions (Signed)
[facility]  Office working hours are Monday - Thursday 8:00 am to 6:00 pm.  Friday 8:00 am to 4:30 pm.  Office phone number is [phone] and fax is [phone].  For non-emergent medical care and clinical advice during office hours:   1. Call office or   2.  Send message or request using MyChart  For non-emergent medical care and clinical advice after office hours:  1.  Call [phone]  2.  Send message or request using MyChart  Emergency care can be obtained at the [facility] ER, Urgent Care or calling 911.    Patient Satisfaction Survey  We appreciate you giving us your e-mail address.  Please watch for our patient satisfaction survey which you will receive by e-mail.  We strive to provide you with the best care possible.  We respect all comments and will take comments into consideration to improve our service.  Thank you for your participation.    MyChart Activation    Thank you for requesting access to MyChart. Please follow the instructions below to securely access and download your online medical record. MyChart allows you to send messages to your doctor, view your test results, renew your prescriptions, schedule appointments, and more.    How Do I Sign Up?    1. In your internet browser, go to [email or web address]  2. Click on the First Time User? Click Here link in the Sign In box. You will be redirect to the New Member Sign Up page.  3. Enter your MyChart Access Code exactly as it appears below. You will not need to use this code after you???ve completed the sign-up process. If you do not sign up before the expiration date, you must request a new code.    MyChart Access Code: Activation code not generated  Current MyChart Status: Active (This is the date your MyChart access code will expire)    4. Enter the last four digits of your Social Security Number (xxxx) and Date of Birth (mm/dd/yyyy) as indicated and click Submit. You will be taken to the next sign-up page.   5. Create a MyChart ID. This will be your MyChart login ID and cannot be changed, so think of one that is secure and easy to remember.  6. Create a MyChart password. You can change your password at any time.  7. Enter your Password Reset Question and Answer. This can be used at a later time if you forget your password.   8. Enter your e-mail address. You will receive e-mail notification when new information is available in MyChart.  9. Click Sign Up. You can now view and download portions of your medical record.  10. Click the Download Summary menu link to download a portable copy of your medical information.    Additional Information    If you have questions, please visit the Frequently Asked Questions section of the MyChart website at [email or web address] Remember, MyChart is NOT to be used for urgent needs. For medical emergencies, dial 911.      As a valued patient, you will be receiving a survey from [employer]. We encourage you to share your thoughts and opinions about the care you received today. Thank you for choosing [facility].

## 2015-01-24 LAB — PAP (IMAGE GUIDED) + HPV HIGH RISK
HPV DNA Probe, High Risk: NEGATIVE
HPV, High Risk: NEGATIVE

## 2015-01-24 NOTE — Progress Notes (Signed)
Quick Note:        01/24/2015--Letter mailed---Rachael Brown    ______

## 2015-01-24 NOTE — Progress Notes (Signed)
Quick Note:        Please notify patient of normal results from her Pap smear.    ______

## 2015-02-25 ENCOUNTER — Encounter

## 2015-02-25 MED ORDER — DICLOFENAC 1 % TOPICAL GEL
1 % | Freq: Four times a day (QID) | CUTANEOUS | Status: AC
Start: 2015-02-25 — End: ?

## 2015-02-25 MED ORDER — LIDOCAINE 5 % (700 MG/PATCH) ADHESIVE PATCH
5 % | PACK | CUTANEOUS | Status: AC
Start: 2015-02-25 — End: ?

## 2015-02-25 NOTE — Progress Notes (Signed)
Received message from nurse stating prior authorization needed for Voltaren Gel and Lidocaine Patch.  [facility] [phone] ID: [id number] spoke with Crystal transferred to commercial department. Spoke with [employer] initiated prior authorization for Lidocaine Patch, clinicals given. Lidocaine Patch approved 01/26/15-02/24/18 Case ID: [id number]. Initiated prior authorization for Voltaren Gel, clinicals given. Voltaren Gel approved 01/26/15-02/25/16 Case ID: [id number]. Notified patient of approval. Patient request Rx to be sent in to [facility] nurse notified.

## 2015-03-11 ENCOUNTER — Ambulatory Visit: Attending: Foot & Ankle Surgery | Primary: Internal Medicine

## 2015-03-11 ENCOUNTER — Encounter: Attending: Physician Assistant | Primary: Internal Medicine

## 2015-03-11 ENCOUNTER — Ambulatory Visit
Admit: 2015-03-11 | Discharge: 2015-03-11 | Payer: PRIVATE HEALTH INSURANCE | Attending: Foot & Ankle Surgery | Primary: Internal Medicine

## 2015-03-11 DIAGNOSIS — G609 Hereditary and idiopathic neuropathy, unspecified: Secondary | ICD-10-CM

## 2015-03-11 MED ORDER — BUPIVACAINE 0.5 % (5 MG/ML) IJ SOLN
0.5 % (5 mg/mL) | Freq: Once | INTRAMUSCULAR | Status: AC
Start: 2015-03-11 — End: 2015-03-11

## 2015-03-11 MED ORDER — LIDOCAINE HCL 1 % (10 MG/ML) IJ SOLN
10 mg/mL (1 %) | Freq: Once | INTRAMUSCULAR | Status: AC
Start: 2015-03-11 — End: 2015-03-11

## 2015-03-11 NOTE — Progress Notes (Signed)
Referred here by Sedalia MutaSusan Sergent for small fiber neuropathy biopsy. States both sides are equally effected.

## 2015-03-11 NOTE — Patient Instructions (Signed)
[facility]  Office working hours are Monday - Thursday 8:30AM-4:30PM, Friday 8:30AM-3:00PM  Office phone number is [phone] and fax is [phone].  For non-emergent medical care and clinical advice during office hours:   1. Call office or   2.  Send message or request using MyChart  For non-emergent medical care and clinical advice after office hours:  1.  Call [phone] and leave a message with our answering service or  2.  Send message or request using MyChart  Emergency care can be obtained at the [facility] ER, Urgent Care or calling 911.    Patient Satisfaction Survey  As a valued patient, you will be receiving a survey from [employer].  We encourage you to share your thoughts and opinions about the care you received today. Thank you for choosing [facility]

## 2015-03-11 NOTE — Progress Notes (Signed)
[facility]    Rachael Brown  53 y.o.  03/11/2015      HPI:  Patient is a 54 y.o. female who presents to the office today for epidermal nerve fiber density biopsy referred by Mosetta Anis.  Patient states she has been having burning and tingling to her feet and legs.  Patient has had this trouble for multiple months.  Patient has a history of fibromyalgia.  Patient is currently being worked up by neurology.  Patient has had no direct treatments at this point.    Past Medical History   Diagnosis Date   ??? Osteoarthritis    ??? DDD (degenerative disc disease)    ??? Migraine    ??? Fibromyalgia    ??? OA (osteoarthritis)    ??? Carpal tunnel syndrome    ??? Menopause    ??? Neuropathy    ??? Intervertebral disc disease 08/06/2014   ??? Bilateral carpal tunnel syndrome 08/06/2014   ??? Fibromyalgia 10/04/2014       Past Surgical History   Procedure Laterality Date   ??? Hx orthopaedic       CTR, Rotator cuff repair   ??? Hx heent       bilat eye   ??? Hx carpal tunnel release Right    ??? Pr anesth,surgery of shoulder Right      Due to osteoarthritis & rotator cuff tendonopathy.   ??? Hx dilation and curettage       & hysteroscopy due to uterine polyps   ??? Hx back surgery  2012       Family History   Problem Relation Age of Onset   ??? Arthritis-osteo Mother    ??? Heart Disease Mother    ??? Diabetes Mother    ??? Arthritis-rheumatoid Mother    ??? Hypertension Mother    ??? SLE Mother    ??? Coronary Artery Disease Mother    ??? Cancer Father    ??? Diabetes Father    ??? Hypertension Father    ??? Hypertension Sister    ??? Heart Disease Brother    ??? Hypertension Brother    ??? Diabetes Brother    ??? Coronary Artery Disease Brother    ??? Heart Disease Brother    ??? Hypertension Brother    ??? Diabetes Brother    ??? Coronary Artery Disease Brother        History     Social History   ??? Marital Status: MARRIED     Spouse Name: N/A   ??? Number of Children: N/A   ??? Years of Education: N/A     Occupational History   ??? Not on file.     Social History Main Topics    ??? Smoking status: Never Smoker    ??? Smokeless tobacco: Not on file   ??? Alcohol Use: 0.0 oz/week     0 Standard drinks or equivalent per week      Comment: social   ??? Drug Use: No   ??? Sexual Activity: Yes     Other Topics Concern   ??? Not on file     Social History Narrative       Allergies   Allergen Reactions   ??? Codeine Unknown (comments)   ??? Duloxetine Other (comments)     Side effects   ??? Percocet [Oxycodone-Acetaminophen] Unknown (comments)   ??? Pregabalin Other (comments)     Side effects from the medication   ??? Savella [Milnacipran] Other (comments)  Current Outpatient Prescriptions   Medication Sig Dispense Refill   ??? lidocaine (XYLOCAINE) 10 mg/mL (1 %) injection 1.5 mL by IntraDERMal route once for 1 dose. 1.5 mL 0   ??? bupivacaine (MARCAINE) 0.5 % (5 mg/mL) soln injection 1.5 mL by SubCUTAneous route once for 1 dose. 1.5 mL 0   ??? diclofenac (VOLTAREN) 1 % gel Apply 4 g to affected area four (4) times daily. Dispense 5 tubes,100 gram each,apply 4 grams QID 1500 g 1   ??? lidocaine (LIDODERM) 5 % Apply patch to the affected area for 12 hours a day and remove for 12 hours a day. 3 Package 2   ??? indomethacin SR (INDOCIN SR) 75 mg SR capsule TAKE 1 CAPSULE TWICE A DAY 180 Cap 1   ??? baclofen (LIORESAL) 10 mg tablet 1-2 tabs at bedtime 60 Tab 5   ??? pyridoxine (VITAMIN B-6) 100 mg tablet Take 100 mg by mouth daily.     ??? tiZANidine (ZANAFLEX) 4 mg tablet Take 1 pill BID during day, and then take 4 pills PO qhs. 540 Tab 1   ??? amitriptyline (ELAVIL) 10 mg tablet 1 tab QHS for 1 week then 2 tabs QHS and then may go to 3 tabs QHS.  Indications: NEUROPATHIC PAIN 270 Tab 3   ??? fluticasone (FLONASE) 50 mcg/actuation nasal spray      ??? aspirin 81 mg tablet Take 81 mg by mouth.     ??? Dexlansoprazole (DEXILANT) 60 mg CpDM Take  by mouth.         Review of Systems:  General: Patient denies any nausea, vomiting, fevers, and chills.  For all other positives and negatives which are pertinent, please refer to  the past medical history and history of present illness.    Physical Exam:  Visit Vitals   Item Reading   ??? BP 132/106 mmHg   ??? Pulse 85   ??? Ht 5\' 2"  (1.575 m)   ??? Wt 168 lb (76.204 kg)   ??? BMI 30.72 kg/m2       General: Patient is awake, alert, and oriented x 3.  Vascular: Patient has palpable pedal pulses to bilateral lower extremities.  Positive pedal and digital hair growth noted.  Skin temperature warm to touch.  Capillary refill time within normal limits.  No gross edema noted.  Neurological: Patient has protective threshold which appears to be decreased to bilateral feet.  Signs and symptoms consistent with peripheral neuropathy.  Dermatological: No signs of ulceration, infection, or break in skin noted.  Musculoskeletal: Muscle strength appears to be within normal limits for the patient's age.  No gross deformities noted.      Assessment:  Encounter Diagnoses   Name Primary?   ??? Pain in left foot    ??? Pain in right foot    ??? Idiopathic peripheral neuropathy Yes   ??? Small fiber neuropathy        Plan:  1. After consent was signed.  The area was anesthestized with 3 mL of 1% lidocaine plain and 0.5% marcaine plain in a 50:50 mix.  A 3mm punch biopsy was then performed 10 cm proximal to the left lateral malleolus, utilizing semi-sterile technique.  The area was dressed with compressive dressing consisting of antibiotic ointment, gauze, and coban.  Patient given post-procedure instructions and will follow them.  2. [facility].  3. Return in 2-3 weeks, call if any problems.

## 2015-03-21 MED ORDER — AMITRIPTYLINE 10 MG TAB
10 mg | ORAL_TABLET | Freq: Every evening | ORAL | Status: DC
Start: 2015-03-21 — End: 2015-04-03

## 2015-03-26 ENCOUNTER — Ambulatory Visit
Admit: 2015-03-26 | Discharge: 2015-03-26 | Payer: PRIVATE HEALTH INSURANCE | Attending: Foot & Ankle Surgery | Primary: Internal Medicine

## 2015-03-26 DIAGNOSIS — G609 Hereditary and idiopathic neuropathy, unspecified: Secondary | ICD-10-CM

## 2015-03-26 NOTE — Patient Instructions (Signed)
[facility]  Office working hours are Monday - Thursday 8:30AM-4:30PM, Friday 8:30AM-3:00PM  Office phone number is [phone] and fax is [phone].  For non-emergent medical care and clinical advice during office hours:   1. Call office or   2.  Send message or request using MyChart  For non-emergent medical care and clinical advice after office hours:  1.  Call [phone] and leave a message with our answering service or  2.  Send message or request using MyChart  Emergency care can be obtained at the [facility] ER, Urgent Care or calling 911.    Patient Satisfaction Survey  As a valued patient, you will be receiving a survey from [employer].  We encourage you to share your thoughts and opinions about the care you received today. Thank you for choosing [facility]

## 2015-03-26 NOTE — Progress Notes (Signed)
recheck bx site, left leg,   need to discuss Bako path results for small nerve fiber neuropathy, states bx site is doing well

## 2015-03-26 NOTE — Progress Notes (Signed)
[facility]      Lucey Conser [facility]  [birth year]  03/26/2015      Subjective:  Patient is a 54 y.o. female who presents to the office today for follow up of epidermal nerve fiber density biopsy on March 11, 2015.  Patient states they are doing well and having no problems.  They have followed their post-procedure instructions.  Patient denies any nausea, vomiting, fevers, and chills.    Objective:  Upon examination of left biopsy site lateral ankle, it is healing well with no signs of infection.    Assessment:   Encounter Diagnoses   Name Primary?   ??? Idiopathic peripheral neuropathy Yes   ??? Small fiber neuropathy        Biopsy Results: negative for small fiber neuropathy    Plan:   1. Stop previous post-procedure instructions.   2. Follow-up with neurology for further treatments.  3. Return to the office as needed.  Call if any problems.    Kamarius Buckbee R DOWDY JR, DPM

## 2015-04-03 ENCOUNTER — Encounter: Attending: Physician Assistant | Primary: Internal Medicine

## 2015-04-03 ENCOUNTER — Ambulatory Visit
Admit: 2015-04-03 | Discharge: 2015-04-03 | Payer: PRIVATE HEALTH INSURANCE | Attending: Physician Assistant | Primary: Internal Medicine

## 2015-04-03 DIAGNOSIS — G629 Polyneuropathy, unspecified: Secondary | ICD-10-CM

## 2015-04-03 MED ORDER — GABAPENTIN ENACARBIL ER 600 MG TABLET,EXTENDED RELEASE
600 mg | ORAL_TABLET | Freq: Every day | ORAL | Status: DC
Start: 2015-04-03 — End: 2016-01-29

## 2015-04-03 NOTE — Progress Notes (Signed)
Sedalia MutaSusan Lataysha Vohra PA-C  [address], [address]  [place], [place] [address]  Phone:  [phone]  Fax:  [phone]     Neurology Routine Follow-up    Patient ID  Name:  Rachael Brown  DOB:  [birth year]  MRN:  [id number]  Age:  54 y.o.  PCP:  None    Subjective:     Encounter Date:  04/03/2015    Referring Physician: No ref. provider found    Chief Complaint   Patient presents with   ??? Results     post test results   ??? Fall     has fallen x 2; can not fell bottom of feet       History of Present Illness:   Rachael Brown is a 54 y.o.  female who returns for a follow up regarding neuropathy.    Previous note from 01/14/15 was reviewed.      She was given some Horizant samples and feels that her pain had improved.  She had her punch biopsy and a follow up with Dr. Freddi Starrowdy.      Biopsy Results: negative for small fiber neuropathy.    Muscle spasms are stable.      No new complaints.     Current Outpatient Prescriptions   Medication Sig Dispense Refill   ??? promethazine (PHENERGAN) 6.25 mg/5 mL syrup   3   ??? gabapentin enacarbil (HORIZANT) 600 mg TbER Take 1 Tab by mouth daily. 30 Tab 5   ??? diclofenac (VOLTAREN) 1 % gel Apply 4 g to affected area four (4) times daily. Dispense 5 tubes,100 gram each,apply 4 grams QID 1500 g 1   ??? lidocaine (LIDODERM) 5 % Apply patch to the affected area for 12 hours a day and remove for 12 hours a day. 3 Package 2   ??? indomethacin SR (INDOCIN SR) 75 mg SR capsule TAKE 1 CAPSULE TWICE A DAY 180 Cap 1   ??? baclofen (LIORESAL) 10 mg tablet 1-2 tabs at bedtime 60 Tab 5   ??? pyridoxine (VITAMIN B-6) 100 mg tablet Take 100 mg by mouth daily.     ??? tiZANidine (ZANAFLEX) 4 mg tablet Take 1 pill BID during day, and then take 4 pills PO qhs. 540 Tab 1   ??? fluticasone (FLONASE) 50 mcg/actuation nasal spray      ??? aspirin 81 mg tablet Take 81 mg by mouth.     ??? Dexlansoprazole (DEXILANT) 60 mg CpDM Take  by mouth.       Allergies   Allergen Reactions   ??? Codeine Unknown (comments)    ??? Duloxetine Other (comments)     Side effects   ??? Percocet [Oxycodone-Acetaminophen] Unknown (comments)   ??? Pregabalin Other (comments)     Side effects from the medication   ??? Savella [Milnacipran] Other (comments)     Patient Active Problem List   Diagnosis Code   ??? OA (osteoarthritis) M19.90   ??? DDD (degenerative disc disease) IMO0002   ??? Carpal tunnel syndrome G56.00   ??? Routine gynecological examination Z01.419   ??? Menopause Z78.0   ??? Neuropathy G62.9   ??? Bilateral low back pain with left-sided sciatica M54.42   ??? H/O cervical spine surgery Z98.89   ??? Intervertebral disc disease M51.9   ??? Bilateral carpal tunnel syndrome G56.01, G56.02   ??? Fibromyalgia M79.7     Past Medical History   Diagnosis Date   ??? Osteoarthritis    ??? DDD (degenerative disc disease)    ???  Migraine    ??? Fibromyalgia    ??? OA (osteoarthritis)    ??? Carpal tunnel syndrome    ??? Menopause    ??? Neuropathy    ??? Intervertebral disc disease 08/06/2014   ??? Bilateral carpal tunnel syndrome 08/06/2014   ??? Fibromyalgia 10/04/2014      Past Surgical History   Procedure Laterality Date   ??? Hx orthopaedic       CTR, Rotator cuff repair   ??? Hx heent       bilat eye   ??? Hx carpal tunnel release Right    ??? Pr anesth,surgery of shoulder Right      Due to osteoarthritis & rotator cuff tendonopathy.   ??? Hx dilation and curettage       & hysteroscopy due to uterine polyps   ??? Hx back surgery  2012      Family History   Problem Relation Age of Onset   ??? Arthritis-osteo Mother    ??? Heart Disease Mother    ??? Diabetes Mother    ??? Arthritis-rheumatoid Mother    ??? Hypertension Mother    ??? SLE Mother    ??? Coronary Artery Disease Mother    ??? Cancer Father    ??? Diabetes Father    ??? Hypertension Father    ??? Hypertension Sister    ??? Heart Disease Brother    ??? Hypertension Brother    ??? Diabetes Brother    ??? Coronary Artery Disease Brother    ??? Heart Disease Brother    ??? Hypertension Brother    ??? Diabetes Brother    ??? Coronary Artery Disease Brother       History      Social History   ??? Marital Status: MARRIED     Spouse Name: N/A   ??? Number of Children: N/A   ??? Years of Education: N/A     Social History Main Topics   ??? Smoking status: Never Smoker    ??? Smokeless tobacco: Not on file   ??? Alcohol Use: 0.0 oz/week     0 Standard drinks or equivalent per week      Comment: social   ??? Drug Use: No   ??? Sexual Activity: Yes     Other Topics Concern   ??? None     Social History Narrative        ROS     Constitutional: Negative for fever, chills and malaise/fatigue.   HENT: Negative for hearing loss and tinnitus.   Eyes: Negative for blurred vision, double vision and photophobia.   Respiratory: Negative for shortness of breath.   Cardiovascular: Negative for chest pain.   Gastrointestinal: Negative for nausea, vomiting and constipation.   Musculoskeletal: Positive for back pain, joint pain and neck pain. Negative for myalgias and falls.   Skin: Negative for rash.   Neurological: Positive for tingling and sensory change. Negative for dizziness, tremors, speech change, focal weakness, seizures, loss of consciousness, weakness and headaches.   Endo/Heme/Allergies: Does not bruise/bleed easily.   Psychiatric/Behavioral: Negative for depression and memory loss. The patient is not nervous/anxious and does not have insomnia.     Objective:     Filed Vitals:    04/03/15 1509   BP: 144/111   Pulse: 81   Height: 5\' 2"  (1.575 m)   Weight: 72.576 kg (160 lb)       Physical Exam  Constitutional: She is oriented to person, place, and time and well-developed, well-nourished, and in  no distress.   HENT:   Head: Normocephalic and atraumatic.   Neck: Neck supple.   Pulmonary/Chest: Effort normal.   Musculoskeletal: Normal range of motion.   Neurological: She is alert and oriented to person, place, and time. She has normal sensation, normal strength and intact cranial nerves. She displays no weakness, no atrophy, no tremor and facial symmetry. She  exhibits normal muscle tone. She has an abnormal Romberg Test. She has a normal Straight Leg Raise Test, a normal Cerebellar Exam, a normal Finger-Nose-Finger Test, a normal Heel to [employer] and a normal Tandem Gait Test. She shows no pronator drift. Coordination   Decreased sensation to PP and TEMP distal LE VIB is slightly decreased and more significant than her last visit.       Impression:     Encounter Diagnoses     ICD-10-CM ICD-9-CM   1. Neuropathy G62.9 355.9   2. Fibromyalgia M79.7 729.1   3. Muscle spasms of both lower extremities M62.838 728.85       Will treat her neuropathic pain.    No evidence of a small fiber neuropathy.   Will give samples of Horizant and try to get it approved.      Plan:     Orders Placed This Encounter   ??? gabapentin enacarbil (HORIZANT) 600 mg TbER     Sig: Take 1 Tab by mouth daily.     Dispense:  30 Tab     Refill:  5         Time Spent:  15 minutes  50% of face to face time today spent counseling patient regarding The primary encounter diagnosis was Neuropathy. Diagnoses of Fibromyalgia and Muscle spasms of both lower extremities were also pertinent to this visit. and coordinating of care.      Follow-up Disposition:  Return in about 6 months (around 10/03/2015).  Signed By:  Wynonia Sours, PA-C     04/08/2015

## 2015-04-03 NOTE — Patient Instructions (Signed)
As a valued patient, you will be receiving a survey from [employer].  We encourage you to share your thoughts and opinions about the care you received today.  Thank you for choosing [facility].  [facility]  Office working hours are Monday - Thursday 9:00 am to 5:00 pm.  Friday 8 am to 12 noon with limited staff.  Office phone number is [phone] and fax is [phone].  For non-emergent medical care and clinical advice during office hours:   1. Call office or   2.  Send message or request using MyChart  For non-emergent medical care and clinical advice after office hours:  1.  Call [phone] or  2.  Send message or request using MyChart  Emergency care can be obtained at the [facility] ER, Urgent Care or calling 911.    Patient Satisfaction Survey  We appreciate you giving us your e-mail address.  Please watch for our patient satisfaction survey which you will receive by e-mail.  We strive to provide you with the best care possible.  We respect all comments and will take comments into consideration to improve our service.  Thank you for your participation.

## 2015-04-10 ENCOUNTER — Ambulatory Visit: Admit: 2015-04-10 | Discharge: 2015-04-10 | Payer: PRIVATE HEALTH INSURANCE | Primary: Internal Medicine

## 2015-04-10 DIAGNOSIS — M519 Unspecified thoracic, thoracolumbar and lumbosacral intervertebral disc disorder: Secondary | ICD-10-CM

## 2015-04-10 MED ORDER — METHYLPREDNISOLONE 20 MG/ML SUSP FOR INJECTION
20 mg/mL | Freq: Once | INTRAMUSCULAR | Status: AC
Start: 2015-04-10 — End: 2015-04-10

## 2015-04-10 MED ORDER — TIZANIDINE 4 MG TAB
4 mg | ORAL_TABLET | ORAL | Status: DC
Start: 2015-04-10 — End: 2015-08-23

## 2015-04-10 MED ORDER — SULINDAC 200 MG TAB
200 mg | ORAL_TABLET | Freq: Two times a day (BID) | ORAL | Status: DC
Start: 2015-04-10 — End: 2016-01-29

## 2015-04-10 NOTE — Progress Notes (Signed)
[facility]  [address], [place], [place] [address]  Phone:  [phone]  Fax:  [phone]  Dimas AguasHoward L. Cheri GuppyFeinberg, D.O., F.A.C.O.I., F.A.C.R.  Curlene DolphinSamuel Tobi Leinweber, PA-C      Patient Name:  Rachael MayerSharon Jean Brown   D.O.B:  1961-06-04    Date of Service:  04/10/2015   PCP:  None    Subjective:    Patient seen with no new complaints.  She denies new joint pain or swelling.  She denies GI upset or side effects from medications.  Morning stiffness is lasting  30  minutes. Doing back/quad exercise. Walking for aerobic exercise.  No new neurologic complaints.  She denies new skin changes.   She denies new medications or changes since last visit. Back hurts if sits/stands too long in one place. Changing position helps to relieve.     Patient seen with report of continued fatigue - unchanged from last evaluation.  She denies functional problems or restrictions from the fatigue. She states that this does not require additional treatment at this time.  She denies chest pain, shortness of breath or GI upset.  Denies new cognitive impairment.      She denies new joint pain or swelling.  She reports continued generalized pain/tenderness consistent with Fibromyalgia.  Currently sleeping 4-5 uninterrupted hours per night. Baclofen helping to rest better.  Reports waking sometimes refreshed in AM.  Aerobic  Exercise-See above.  Widespread pain index 8.  Severity scale 3 (Waking unrefreshed 1, Cognitive 0, fatigue 1, somatic 1).   Carpal Tunnel Syndrome-Wearing Cock-up wrist splints at night. Hurting bad enough for injections today.   She states she had a heart attack in April. She states that she will be followed by Dr. Chales AbrahamsGupta. She states she was not placed on any new medications after MI. She will discuss with Dr. Chales AbrahamsGupta.         Physical examination:  BP 139/90 mmHg   Pulse 74   Resp 16   Ht 5\' 2"  (1.575 m)   Wt 168 lb (76.204 kg)   BMI 30.72 kg/m2  Heart regular rate and rhythm with no rubs.     Lungs clear to auscultation with no rubs.    Joint -Tenderness to fibromyalgia trigger points (8/19). Severity scale-See above.     Spine: Cervical thoracic and lumbar spine have stable functional ROM with no    new deformity.  There is no scoliosis.  There is no change in the lordotic and    kyphotic curvatures.     Shoulder: Shoulders have stable active and passive ROM. There is no pain on    ROM testing.  There are no effusions or synovitis.  There is no warmth or   erythema.   Elbow: Elbow ROM is unchanged.  There is no active synovitis, effusion,    warmth, or erythema.     Wrist: Wrists have stable flexion and extension with no active synovitis or    effusion. Tenderness to anterior wrist palpation bilaterally. Positive Tinel Sign.    Finger: MCP and PIP joints have no active synovitis or effusion. DIP and first    CMC joints are unchanged.  There is no active synovitis or effusion of the DIP    and CMC joints.      Hip: Hips have stable ROM.  There is no pain on palpation or ROM.     Knees: Knee flexion and extension is unchanged.  There are no new palpable    deformities.  There  are no effusions or active synovitis  There is no warmth or    erythema.   Ankle: Ankles have stable ROM with no effusion or synovitis.    Toe: MTP and distal toe joints are unchanged.  There is no active synovitis,  warmth, erythema  or effusion.    Impressions:  Encounter Diagnoses     ICD-10-CM ICD-9-CM   1. Intervertebral disc disease M51.9 722.90   2. Primary osteoarthritis involving multiple joints M15.0 715.09   3. Fibromyalgia M79.7 729.1   4. Bilateral carpal tunnel syndrome G56.01 354.0    G56.02          Plan:  1. Continue current medications  2. 20 mg Depo Medrol with 1/2 ml 1% Lidocaine injection of bilateral wrists. Alcohol prep. Patient sitting with wrists resting on exam table during injections.  Patient tolerated well. Use of ice this P.M. For any injection site pain/soreness.    3. Patient will monitor B/P closely and GO TO ER if elevated blood pressure or if new HA, Chest pain, SOB, or Dizziness. She denies any symptoms in  office today.  4. Continue daily 81 mg Aspirin regimen.   5. Trial of Clinoril 200 mg BID with food for OA pain relief. Call office in 2 weeks with OA pain report.    6. Treatment alternatives were discussed.  7. Continue Cock-up wrist splints at night for control of Carpal Tunnel Syndrome symptom control.  8. Will review imaging/Lab for monitoring of disease and treatment from 03/01/2015.  9. Patient will follow with Dr. Chales Abrahams for post MI monitoring and therapy.   10. Prior lab were reviewed and reviewed with the patient  11. Current home physical therapy exercise program was reviewed.  12. Continue back/quad exercise.   13. Aerobic exercise to 5 times weekly at 30 minutes.   14. Proper dosing of calcium and vitamin D were reviewed.  15. Call office if any new changes.   Orders Placed This Encounter   ??? METHYLPREDNISOLONE ACETATE INJECTION 20 MG     Order Specific Question:  Dose     Answer:  20 mg     Order Specific Question:  Site     Answer:  LEFT WRIST     Order Specific Question:  Expiration Date     Answer:  10/04/2017     Order Specific Question:  Lot#     Answer:  [id number]     Order Specific Question:  Manufacturer     Answer:  [employer]     Order Specific Question:  Charge Quantity?     Answer:  1     Order Specific Question:  Perfomed by/Witnessed by:     Answer:  SP   ??? METHYLPREDNISOLONE ACETATE INJECTION 20 MG     Order Specific Question:  Dose     Answer:  20 mg     Order Specific Question:  Site     Answer:  RIGHT WRIST     Order Specific Question:  Expiration Date     Answer:  10/04/2017     Order Specific Question:  Lot#     Answer:  [id number]     Order Specific Question:  Manufacturer     Answer:  [employer]     Order Specific Question:  Charge Quantity?     Answer:  1     Order Specific Question:  Perfomed by/Witnessed by:     Answer:  SP    ??? tiZANidine (ZANAFLEX) 4 mg tablet  Sig: Take 1 pill BID during day, and then take 4 pills PO qhs.     Dispense:  540 Tab     Refill:  1   ??? sulindac (CLINORIL) 200 mg tablet     Sig: Take 1 Tab by mouth two (2) times a day.     Dispense:  60 Tab     Refill:  3   ??? methylPREDNISolone acetate (DEPO-MEDROL) 20 mg/mL susp     Sig: 1 mL by Intra artICUlar route once for 1 dose.     Dispense:  1 Vial     Refill:  0   ??? methylPREDNISolone acetate (DEPO-MEDROL) 20 mg/mL susp     Sig: 1 mL by Intra artICUlar route once for 1 dose.     Dispense:  1 Vial     Refill:  0     Follow-up Disposition:  Return in about 3 months (around 07/11/2015) for Follow-up.      Lilia Argue, PA

## 2015-04-19 NOTE — Telephone Encounter (Signed)
Rachael Brown DecemberSharon called in stating that Rachael Brown had switched her from indocin to clinoril to see if it would help more.She started taking clinoril on 04-11-15 and stopped it 04-18-15 due to Extremely blurred vision,excessive seating,dry mouth,headache,nausea,dizzy.She went back on indocin and just wanted to let you know.

## 2015-04-22 NOTE — Telephone Encounter (Signed)
OK. Thanks.

## 2015-04-22 NOTE — Progress Notes (Signed)
Patient states after starting on Horizant x 7 days recently, has had unbalanced, blurred vision, dark urine...Marland Kitchen.Marland Kitchen.Encouraged patient to check with PCP for the dark urine and other symptoms and I would let [facility] Mut[facility]Susan [facility] know as well

## 2015-04-22 NOTE — Progress Notes (Signed)
Check back with her in a couple of days.

## 2015-04-30 ENCOUNTER — Encounter: Attending: Physician Assistant | Primary: Internal Medicine

## 2015-05-08 ENCOUNTER — Encounter

## 2015-05-08 MED ORDER — BACLOFEN 10 MG TAB
10 mg | ORAL_TABLET | ORAL | Status: DC
Start: 2015-05-08 — End: 2015-05-09

## 2015-05-09 ENCOUNTER — Encounter

## 2015-05-09 MED ORDER — BACLOFEN 10 MG TAB
10 mg | ORAL_TABLET | ORAL | Status: DC
Start: 2015-05-09 — End: 2015-08-23

## 2015-07-10 ENCOUNTER — Encounter: Primary: Internal Medicine

## 2015-07-10 ENCOUNTER — Ambulatory Visit
Admit: 2015-07-10 | Discharge: 2015-07-10 | Payer: PRIVATE HEALTH INSURANCE | Attending: Rheumatology | Primary: Internal Medicine

## 2015-07-10 ENCOUNTER — Inpatient Hospital Stay: Admit: 2015-07-10 | Payer: BLUE CROSS/BLUE SHIELD | Primary: Internal Medicine

## 2015-07-10 DIAGNOSIS — M2550 Pain in unspecified joint: Secondary | ICD-10-CM

## 2015-07-10 NOTE — Progress Notes (Signed)
CHIEF COMPLAINT:   Chief Complaint   Patient presents with   ??? Joint Pain     Knee,Back,Hips,Feet,Shoulder   ??? Osteoarthritis   ??? Other     Pt states she is due Wrist injections this visit   ??? Other     recent Knee Scope, starting PT tomorrow         HPI:    54 year old female came for   Records reviewed  Last seen 04/2015       Subjective    S/p arthroscopy Rt Knee 04/29/15 doing PT  Multiple joint pain stiffness strong FH RA  CTS s/p bilateral injections  neuropethy in upper and lower extremities sees neurologist On neurontin baclofen  FMS on elavil zanaflex elavil  Currently doing aquatic Therapy           PAST DRUG HISTORY: failed lyrica cymbalta savella  PAIN :4 /10 location ,achy aggravating factors activity  change of weather  relieving factors rest  medications ,able to perform ADL  FATIGUE:  2/10  No h/o dactylitis uveitis  No h/o fever chills sphincter disturbances ,radiculation paresthesia  No Bitemporal headache jaw claudication sudden loss of vision  Sleep/snoring  H/o anxiety /depression  No recent infection /hospitilization      ROS:   GEN : No fever night sweats or wt loss, lympadenopathy   EYES: No discharge or redness dryness   ENT : No nasal or ear discharge.No sore throat   RESP  Denied SOB,Cough,Hemoptysis.pleurisy  GI No abd pain diarhea or GI bleed.   CVS No CP ,palpitation or SOB claudication   CNS : No headaches, siezures or weakness paresthesia  SKIN :No skin rash  GU : No hematuria or discharge no h/o stones  Connective tissue : + myalgia joint pain back pain Raynauds,alopecia,photosensitivity or mouth ulcers.No sicca symptoms, dvt,    EXAMINATION  Visit Vitals   ??? BP (!) 135/98   ??? Pulse 65   ??? Resp 16   ??? Ht '5\' 2"'  (1.575 m)   ??? Wt 164 lb (74.4 kg)   ??? BMI 30 kg/m2     Alert and oriented X 3  no distress.   Eyes: Pupils equal and reacting to light.Normal conjunctiva  ENT: Moist nasal and oral mucosa .No external deformity.  Neck: Supple.No significant thyromegaly   Lymph: NO significant lymphadenopathy in the neck or axillae.   Skin: No active rash  Tattoos   GIT abdomen soft NT BS + no viseromegaly  CVS S1+2  Chest CTA  Neuro:  Power 5/5 in all extremities No proximal muscle weakness sensation intact and coordination normal   Pshyche : Normal Affect   GAIT Normal transfer ok no use of cane or walker  MS   Hands :Normal ROM.No synovitis.Hand grip normal, OA changes,Palen and tinel -ve, flexor tendon thickening,   Hand deformities  Wrists:Normal ROM. No synovitis.    Elbows :ROM Normal,No nodules,No synvitis  Shoulders  :ROM Normal. Trigger point  Neck: No Significant decrease in ROM.No tenderness  Hips : ROM normal.No trochanteric tenderness  Knees : crepitus mild.No effusion  Ankle: Normal ROM.No swelling.No synovitis  Toe: ?? There is no active synovitis, warmth,  erythema?? or effusion.  Spine:No tenderness.SLR negative bilateral ,tight hamstrings  Tender trigger points :   Synovitis : Negative     TJC:   SJC:    CDAI:                  INVESTIGATION:  BLOOD WORK:   SEROLOGIES:  Immunization     HEPATITIS SCREEN:         PPD/Quntiferon:      CXR:   X-RAY HANDS:          MRI :         BMD:     IMPRESSION :    Encounter Diagnoses   Name Primary?   ??? Arthralgia, unspecified joint Yes   ??? Primary osteoarthritis involving multiple joints    ??? Bilateral carpal tunnel syndrome    ??? Fibromyalgia    ??? Intervertebral disc disease    ??? Neuropathic pain    ??? Stiffness in joint          PLAN    Multiple joint pain stiffness, FH OF RA  Will check CBC chem ESR CRP RF ccp xrays of hands  CTS will hold injections, OT  Paraffin wax  Neuropathy follows neurologist on gabapentin baclofen  FMS elavil /zanaflex  Back surgery        Refills yes    Risk and benefit of medications/disease has been discussed, read drug information  Weight loss  Increase activity as tolerated, low impact stretching, Quads strengthening, Hamstrings stretching  Any question please call   Hold Dmards/biologic if any acute infection and contact office  Minimal OTC medications  Use Of VT D Fish oil    Judeth Horn, MD

## 2015-07-11 LAB — METABOLIC PANEL, COMPREHENSIVE
A-G Ratio: 1.3 (ref 1.2–2.2)
ALT (SGPT): 30 U/L (ref 12–78)
AST (SGOT): 27 U/L (ref 15–37)
Albumin: 4.3 g/dL (ref 3.4–5.0)
Alk. phosphatase: 61 U/L (ref 45–117)
Anion gap: 9 mmol/L (ref 6–15)
BUN/Creatinine ratio: 18 (ref 7–25)
BUN: 13 MG/DL (ref 7–18)
Bilirubin, total: 0.5 MG/DL (ref <1.1)
CO2: 26 mmol/L (ref 21–32)
Calcium: 9.2 MG/DL (ref 8.5–10.1)
Chloride: 108 mmol/L — ABNORMAL HIGH (ref 98–107)
Creatinine: 0.74 MG/DL (ref 0.60–1.30)
GFR est AA: >60 mL/min/{1.73_m2} (ref >60)
GFR est non-AA: >60 mL/min/{1.73_m2} (ref >60)
Globulin: 3.2 g/dL (ref 2.4–3.5)
Glucose: 81 mg/dL (ref 70–110)
Potassium: 3.8 mmol/L (ref 3.5–5.3)
Protein, total: 7.5 g/dL (ref 6.4–8.2)
Sodium: 143 mmol/L (ref 136–145)

## 2015-07-11 LAB — CBC WITH AUTOMATED DIFF
ABS. BASOPHILS: 0 10*3/uL (ref 0.0–0.1)
ABS. EOSINOPHILS: 0.1 10*3/uL (ref 0.0–0.5)
ABS. LYMPHOCYTES: 1.9 10*3/uL (ref 0.8–3.5)
ABS. MONOCYTES: 0.5 10*3/uL — ABNORMAL LOW (ref 2.0–8.0)
ABS. NEUTROPHILS: 4.4 10*3/uL (ref 1.5–8.0)
BASOPHILS: 0 % (ref 0–2)
EOSINOPHILS: 1 % (ref 0–5)
HCT: 39.2 % — ABNORMAL LOW (ref 41–53)
HGB: 12.6 g/dL (ref 12.0–16.0)
LYMPHOCYTES: 28 % (ref 19–48)
MCH: 27.4 PG (ref 27–31)
MCHC: 32.1 g/dL (ref 31–37)
MCV: 85.2 FL (ref 80–100)
MONOCYTES: 7 % (ref 3–9)
MPV: 10.7 FL — ABNORMAL HIGH (ref 5.9–10.3)
NEUTROPHILS: 64 % (ref 40–74)
PLATELET: 275 10*3/uL (ref 130–400)
RBC: 4.6 M/uL (ref 4.2–5.4)
RDW: 16.6 % — ABNORMAL HIGH (ref 11.5–14.5)
WBC: 6.9 10*3/uL (ref 4.5–10.8)

## 2015-07-11 LAB — RHEUMATOID FACTOR, QL: Rheumatoid factor, QL: NEGATIVE

## 2015-07-11 LAB — C REACTIVE PROTEIN, QT: C-Reactive protein: <0.2 mg/dL (ref <0.32)

## 2015-07-11 LAB — SED RATE, AUTOMATED: Sed rate, automated: 14 mm/hr (ref 0–25)

## 2015-07-11 LAB — URIC ACID: Uric acid: 4.1 MG/DL (ref 2.6–7.2)

## 2015-07-12 LAB — ANA BY MULTIPLEX FLOW IA, QL
ANA, Direct: NEGATIVE
ANA: NEGATIVE

## 2015-07-13 LAB — CYCLIC CITRUL PEPTIDE AB, IGG: CCP Antibodies IgG/IgA: 5 units (ref 0–19)

## 2015-07-16 ENCOUNTER — Inpatient Hospital Stay: Admit: 2015-07-16 | Payer: BLUE CROSS/BLUE SHIELD | Primary: Internal Medicine

## 2015-07-16 DIAGNOSIS — M79642 Pain in left hand: Secondary | ICD-10-CM

## 2015-07-16 DIAGNOSIS — M2550 Pain in unspecified joint: Secondary | ICD-10-CM

## 2015-07-17 NOTE — Progress Notes (Signed)
Normal cbc chem ESR crp  Negative tests for RA ,negative RF CCP ANA  Normal xrays of hands

## 2015-07-26 NOTE — Telephone Encounter (Signed)
We received disability paperwork from [employer]'s attorney.We do not fill those out per [facility].She needs to have a function test.She is aware that we will not fill these out.She understood.

## 2015-08-23 ENCOUNTER — Telehealth

## 2015-08-23 MED ORDER — BACLOFEN 10 MG TAB
10 mg | ORAL_TABLET | ORAL | 1 refills | Status: AC
Start: 2015-08-23 — End: ?

## 2015-08-23 MED ORDER — TIZANIDINE 4 MG TAB
4 mg | ORAL_TABLET | ORAL | 1 refills | Status: DC
Start: 2015-08-23 — End: 2018-01-26

## 2015-08-23 NOTE — Telephone Encounter (Signed)
I wanted clarification on her zanaflex dose,which Sam has filled previously.She said she takes 1 tab every 8 hours and 4 pills at HS.She wants a 90 day supply.The previous script was for 540.Do I fill it as such?    Reviewed chart and medication she is on 2 muscle relaxant baclofen and Zanaflex she can have only 2 Zanaflex during the day and to baclofen at night , please explained to her

## 2015-08-23 NOTE — Telephone Encounter (Signed)
I wanted clarification on her zanaflex dose,which Sam has filled previously.She said she takes 1 tab every 8 hours and 4 pills at HS.She wants a 90 day supply.The previous script was for 540.Do I fill it as such?

## 2015-08-23 NOTE — Telephone Encounter (Signed)
I called Rachael Brown to explain to her that she cannot take so much zanaflex(4 mg twice daily and 4 at night).She was not too happy,but she said if that is all he will write for ,she'll take it.She understands and I sent a refill for both to [facility].

## 2015-09-11 ENCOUNTER — Encounter: Attending: Rheumatology | Primary: Internal Medicine

## 2015-10-03 ENCOUNTER — Encounter: Attending: Physician Assistant | Primary: Internal Medicine

## 2015-11-27 ENCOUNTER — Encounter

## 2015-12-09 ENCOUNTER — Encounter: Attending: Physician Assistant | Primary: Internal Medicine

## 2016-01-08 ENCOUNTER — Inpatient Hospital Stay: Admit: 2016-01-08 | Payer: BLUE CROSS/BLUE SHIELD | Attending: Obstetrics & Gynecology | Primary: Internal Medicine

## 2016-01-08 DIAGNOSIS — Z1231 Encounter for screening mammogram for malignant neoplasm of breast: Secondary | ICD-10-CM

## 2016-01-29 ENCOUNTER — Inpatient Hospital Stay: Admit: 2016-01-29 | Payer: BLUE CROSS/BLUE SHIELD | Primary: Internal Medicine

## 2016-01-29 ENCOUNTER — Ambulatory Visit
Admit: 2016-01-29 | Discharge: 2016-01-29 | Payer: PRIVATE HEALTH INSURANCE | Attending: Family | Primary: Internal Medicine

## 2016-01-29 DIAGNOSIS — Z01419 Encounter for gynecological examination (general) (routine) without abnormal findings: Secondary | ICD-10-CM

## 2016-01-29 LAB — AMB POC FECAL BLOOD, OCCULT, QL 3 CARDS: Hemoccult (POC): NEGATIVE

## 2016-01-29 NOTE — Progress Notes (Signed)
[facility]         Phone:  [phone]         Fax:  [phone]    Patient:  Rachael Brown DOB:  1961/06/25  MRN:  [id number]  D.O.S.:  01/29/2016    History of Present Illness:  Rachael Brown, a 55 y.o. female who is here today for annual exam  Chief Complaint   Patient presents with   ??? Annual Exam        55 y/o for annual exam. Pap in 2016 was wnl. Mammogram up to date. Last colonoscopy 2015. No voiced concerns at this time    No LMP recorded. Patient is postmenopausal.   Allergies   Allergen Reactions   ??? Codeine Unknown (comments)   ??? Duloxetine Other (comments)     Side effects   ??? Percocet [Oxycodone-Acetaminophen] Unknown (comments)   ??? Pregabalin Other (comments)     Side effects from the medication   ??? Savella [Milnacipran] Other (comments)     Current Outpatient Prescriptions   Medication Sig Dispense Refill   ??? tiZANidine (ZANAFLEX) 4 mg tablet Take 4 mg BID. 180 Tab 1   ??? baclofen (LIORESAL) 10 mg tablet 1-2 tabs at bedtime 180 Tab 1   ??? amitriptyline (ELAVIL) 10 mg tablet Take 30 mg by mouth nightly.     ??? promethazine (PHENERGAN) 6.25 mg/5 mL syrup   3   ??? diclofenac (VOLTAREN) 1 % gel Apply 4 g to affected area four (4) times daily. Dispense 5 tubes,100 gram each,apply 4 grams QID 1500 g 1   ??? lidocaine (LIDODERM) 5 % Apply patch to the affected area for 12 hours a day and remove for 12 hours a day. 3 Package 2   ??? pyridoxine (VITAMIN B-6) 100 mg tablet Take 100 mg by mouth daily.     ??? fluticasone (FLONASE) 50 mcg/actuation nasal spray      ??? aspirin 81 mg tablet Take 81 mg by mouth.     ??? Dexlansoprazole (DEXILANT) 60 mg CpDM Take  by mouth.       Vitals:    01/29/16 0805   BP: 131/79   Pulse: 75   Resp: 16   Weight: 157 lb (71.2 kg)   Height:  (1.575 m)   PainSc:   0 - No pain     Body mass index is 28.72 kg/(m^2).    Menstrual History  Menarche: 10  Period cycle: Post Menopausal  Comments: Post Menopausal    Contraception   Current form of contraception: None  Previous forms of contracpetion: None         Mammogram/Pap  Mammogram Date: 01/07/16  Mammogram Results: WNL  Pap Date: 10/26/14  Pap Results: WNL    STD History  STD History: None    OB History     Gravida Para Term Preterm AB TAB SAB Ectopic Multiple Living            Primary Care Provider:  Nani Skillern, MD   Referred to [facility] by:  No ref. provider found   Preferred Hospital:  [facility]     Breast Self-Exam:  YES    Review of Systems:   General:  no fever, chills, or body aches   Skin:  no hives, itching or change in skin color   Eyes:  no blurred vision, eye pain, or discharge   Ears:  no ear pain,  discharge, or difficulty hearing   Nose: no nasal congestion, discharge, or bleeding   Neck:  no pain or swelling   Respiratory:  no shortness of breath, cough, wheezing, or bloody sputum   Cardiac:  no chest pains or palpitations   G/I:  no nausea, vomiting, diarrhea, constipation, or abdominal pain   Musculo-Skeletal:  no joint or muscle pain, no back pain   G/U:  no dysuria, frequency of urination, or slow stream   Neuro:  no slurred speech, seizures, or localized weakness   Other Systems:  WNL    Past Medical History:   Diagnosis Date   ??? Bilateral carpal tunnel syndrome 08/06/2014   ??? Carpal tunnel syndrome    ??? DDD (degenerative disc disease)    ??? Fibromyalgia    ??? Fibromyalgia 10/04/2014   ??? Intervertebral disc disease 08/06/2014   ??? Menopause    ??? Migraine    ??? Neuropathy    ??? OA (osteoarthritis)    ??? Osteoarthritis      Past Surgical History:   Procedure Laterality Date   ??? HX BACK SURGERY  2012   ??? HX CARPAL TUNNEL RELEASE Right    ??? HX DILATION AND CURETTAGE      & hysteroscopy due to uterine polyps   ??? HX HEENT      bilat eye   ??? HX ORTHOPAEDIC      CTR, Rotator cuff repair   ??? PR ANESTH,SURGERY OF SHOULDER Right     Due to osteoarthritis & rotator cuff tendonopathy.     Family History   Problem Relation Age of Onset   ??? Arthritis-osteo Mother     ??? Heart Disease Mother    ??? Diabetes Mother    ??? Arthritis-rheumatoid Mother    ??? Hypertension Mother    ??? SLE Mother    ??? Coronary Artery Disease Mother    ??? Cancer Father    ??? Diabetes Father    ??? Hypertension Father    ??? Hypertension Sister    ??? Heart Disease Brother    ??? Hypertension Brother    ??? Diabetes Brother    ??? Coronary Artery Disease Brother    ??? Heart Disease Brother    ??? Hypertension Brother    ??? Diabetes Brother    ??? Coronary Artery Disease Brother      Social History     Social History   ??? Marital status: MARRIED     Spouse name: N/A   ??? Number of children: N/A   ??? Years of education: N/A     Social History Main Topics   ??? Smoking status: Never Smoker   ??? Smokeless tobacco: None   ??? Alcohol use 0.0 oz/week     0 Standard drinks or equivalent per week      Comment: social   ??? Drug use: No   ??? Sexual activity: Yes     Partners: Male     Birth control/ protection: None     Other Topics Concern   ??? None     Social History Narrative     Physical Examination:   General appearance - alert, well appearing, and in no distress   Skin - normal coloration and turgor, no rashes   Mental status - alert, oriented to person, place, and time   HEENT - NC, AT   Neck - supple, no thyromegaly   Lungs - clear to auscultation, no wheezes, rales or rhonchi   Breasts - no  adenopathy, dimpling, or skin retractions noted, no discharge and non-tender   Heart - normal rate, regular rhythm, normal S1, S2, no murmurs, rubs, clicks or gallops   Abdomen - soft, non-tender, + BS, negative hepatosplenomegaly   Extremities - without clubbing, cyanosis or edema   Neurological - alert, oriented x3   Musculoskeletal - no joint tenderness, deformity or swelling   Pelvic -     EGBUS: WNL    VAGINA: atrophic and decreased rugations    CERVIX: without lesion    UTERUS: AV, NSSC, Mobile, NT    ADNEXA: non-tender, no masses   Rectal - negative guaiac test   Pelvis - WNL   Inguinal Lymph Nodes - negative    Diagnostic Test Results:       Results for orders placed or performed in visit on 01/29/16   AMB POC FECAL BLOOD, OCCULT, QL 3 CARDS   Result Value Ref Range    VALID INTERNAL CONTROL POC Yes     Hemoccult (POC) Negative Negative    Occult Blood-2 (POC)      Occult blood-3 (POC)           Decision Making (PLAN):    ICD-10-CM ICD-9-CM    1. Encounter for routine gynecological examination with Papanicolaou smear of cervix Z01.419 V72.31 PAP, LB, RFX HPV [id number])     V76.2    2. Screening breast examination Z12.39 V76.10    3. Screening for malignant neoplasm of the rectum Z12.12 V76.41 AMB POC FECAL BLOOD, OCCULT, QL 3 CARDS     Orders Placed This Encounter   ??? AMB FECAL OCCULT BLOOD QL-3 CARDS   ??? PAP, LB, RFX HPV [id number])       Other Notes:54 y.o for annual exam  ?? Gyn exam/pap  ?? Mammogram up to date, wnl  ?? Last colonoscopy 2015. Follow up for next one as per GI recommendations  ?? Follow up in 1 year for annual exam and prn new concerns  ?? The above was discussed in detail with the patient who voices understanding and is in agreement to proposed plan  ?? Further recommendations pending patient clinical course  ?? At least 15 minutes were spent with the patient where more than 50% of time was spent counseling the patient     Follow-up Disposition:  Return in about 1 year (around 01/28/2017).  Crista Luria, NP  01/29/2016 8:09 AM

## 2016-01-29 NOTE — Progress Notes (Signed)
Please notify patient via letter of negative pap results

## 2016-01-29 NOTE — Patient Instructions (Signed)
[facility]  Office working hours are Monday - Thursday 8:00 am to 6:00 pm.  Friday 8:00 am to 4:30 pm.  Office phone number is [phone] and fax is [phone].  For non-emergent medical care and clinical advice during office hours:   1. Call office or   2.  Send message or request using MyChart  For non-emergent medical care and clinical advice after office hours:  1.  Call [phone]  2.  Send message or request using MyChart  Emergency care can be obtained at the [facility] ER, Urgent Care or calling 911.    Patient Satisfaction Survey  We appreciate you giving us your e-mail address.  Please watch for our patient satisfaction survey which you will receive by e-mail.  We strive to provide you with the best care possible.  We respect all comments and will take comments into consideration to improve our service.  Thank you for your participation.  MyChart Activation    Thank you for requesting access to MyChart. Please follow the instructions below to securely access and download your online medical record. MyChart allows you to send messages to your doctor, view your test results, renew your prescriptions, schedule appointments, and more.    How Do I Sign Up?    1. In your internet browser, go to [email or web address]  2. Click on the First Time User? Click Here link in the Sign In box. You will be redirect to the New Member Sign Up page.  3. Enter your MyChart Access Code exactly as it appears below. You will not need to use this code after you???ve completed the sign-up process. If you do not sign up before the expiration date, you must request a new code.    MyChart Access Code: Activation code not generated  Current MyChart Status: Active (This is the date your MyChart access code will expire)    4. Enter the last four digits of your Social Security Number (xxxx) and Date of Birth (mm/dd/yyyy) as indicated and click Submit. You will be taken to the next sign-up page.   5. Create a MyChart ID. This will be your MyChart login ID and cannot be changed, so think of one that is secure and easy to remember.  6. Create a MyChart password. You can change your password at any time.  7. Enter your Password Reset Question and Answer. This can be used at a later time if you forget your password.   8. Enter your e-mail address. You will receive e-mail notification when new information is available in MyChart.  9. Click Sign Up. You can now view and download portions of your medical record.  10. Click the Download Summary menu link to download a portable copy of your medical information.    Additional Information    If you have questions, please visit the Frequently Asked Questions section of the MyChart website at [email or web address] Remember, MyChart is NOT to be used for urgent needs. For medical emergencies, dial 911.    As a valued patient, you will be receiving a survey from [employer].  We encourage you to share your thoughts and opinions about the care you received today.  Thank you for choosing [facility].

## 2016-01-31 LAB — PAP, LB, RFX HPV ASCUS(507301)

## 2017-01-28 ENCOUNTER — Encounter

## 2017-02-01 ENCOUNTER — Ambulatory Visit
Admit: 2017-02-01 | Discharge: 2017-02-01 | Payer: PRIVATE HEALTH INSURANCE | Attending: Family | Primary: Internal Medicine

## 2017-02-01 ENCOUNTER — Inpatient Hospital Stay: Admit: 2017-02-01 | Payer: BLUE CROSS/BLUE SHIELD | Primary: Internal Medicine

## 2017-02-01 DIAGNOSIS — Z01419 Encounter for gynecological examination (general) (routine) without abnormal findings: Secondary | ICD-10-CM

## 2017-02-01 LAB — AMB POC FECAL BLOOD, OCCULT, QL 3 CARDS: Hemoccult (POC): NEGATIVE

## 2017-02-01 NOTE — Progress Notes (Signed)
[facility]         Phone:  [phone]         Fax:  [phone]    Patient:  Rachael Brown DOB:  [birth year]  MRN:  [id number]  D.O.S.:  02/01/2017    History of Present Illness:  Rachael Brown, a 56 y.o. female who is here today for annual exam  Chief Complaint   Patient presents with   ??? Annual Exam        56 y/o for annual exam. Pt is doing well. Mammogram scheduled. Last colonoscopy in 2015. No new concerns.,      No LMP recorded. Patient is postmenopausal.   Allergies   Allergen Reactions   ??? Codeine Unknown (comments)   ??? Duloxetine Other (comments)     Side effects   ??? Percocet [Oxycodone-Acetaminophen] Unknown (comments)   ??? Pregabalin Other (comments)     Side effects from the medication   ??? Savella [Milnacipran] Other (comments)     Current Outpatient Prescriptions   Medication Sig Dispense Refill   ??? tiZANidine (ZANAFLEX) 4 mg tablet Take 4 mg BID. 180 Tab 1   ??? baclofen (LIORESAL) 10 mg tablet 1-2 tabs at bedtime 180 Tab 1   ??? amitriptyline (ELAVIL) 10 mg tablet Take 30 mg by mouth nightly.     ??? promethazine (PHENERGAN) 6.25 mg/5 mL syrup   3   ??? diclofenac (VOLTAREN) 1 % gel Apply 4 g to affected area four (4) times daily. Dispense 5 tubes,100 gram each,apply 4 grams QID 1500 g 1   ??? lidocaine (LIDODERM) 5 % Apply patch to the affected area for 12 hours a day and remove for 12 hours a day. 3 Package 2   ??? pyridoxine (VITAMIN B-6) 100 mg tablet Take 100 mg by mouth daily.     ??? fluticasone (FLONASE) 50 mcg/actuation nasal spray      ??? aspirin 81 mg tablet Take 81 mg by mouth.     ??? Dexlansoprazole (DEXILANT) 60 mg CpDM Take  by mouth.       Vitals:    02/01/17 0825   BP: (!) 126/94   Pulse: 72   Resp: 16   Weight: 161 lb 8 oz (73.3 kg)   Height:  (1.575 m)   PainSc:   0 - No pain     Body mass index is 29.54 kg/(m^2).    Menstrual History  Menarche: 10  Period cycle: Post Menopausal    Contraception  Current form of contraception: None   Previous forms of contracpetion: None         Mammogram/Pap  Mammogram Date: 01/08/16  Mammogram Results: WNL  Pap Date: 01/29/16  Pap Results: WNL    STD History  STD History: None    OB History     Gravida Para Term Preterm AB Living    0 0 0 0 0 0    SAB TAB Ectopic Molar Multiple Live Births    0 0 0  0          Primary Care Provider:  Nani Skillern, MD   Referred to [facility] by:  No ref. provider found   Preferred Hospital:  [facility]     Breast Self-Exam:  YES    Review of Systems:   General:  no fever, chills, or body aches   Skin:  no hives, itching or change in skin color   Eyes:  no blurred vision, eye pain, or discharge  Ears:  no ear pain, discharge, or difficulty hearing   Nose: no nasal congestion, discharge, or bleeding   Neck:  no pain or swelling   Respiratory:  no shortness of breath, cough, wheezing, or bloody sputum   Cardiac:  no chest pains or palpitations   G/I:  no nausea, vomiting, diarrhea, constipation, or abdominal pain   Musculo-Skeletal:  no joint or muscle pain, no back pain   G/U:  no dysuria, frequency of urination, or slow stream   Neuro:  no slurred speech, seizures, or localized weakness   Other Systems:  WNL    Past Medical History:   Diagnosis Date   ??? Bilateral carpal tunnel syndrome 08/06/2014   ??? Carpal tunnel syndrome    ??? DDD (degenerative disc disease)    ??? Fibromyalgia    ??? Fibromyalgia 10/04/2014   ??? Intervertebral disc disease 08/06/2014   ??? Menopause    ??? Migraine    ??? Neuropathy    ??? OA (osteoarthritis)    ??? Osteoarthritis      Past Surgical History:   Procedure Laterality Date   ??? HX BACK SURGERY  2012   ??? HX CARPAL TUNNEL RELEASE Right    ??? HX DILATION AND CURETTAGE      & hysteroscopy due to uterine polyps   ??? HX HEENT      bilat eye   ??? HX ORTHOPAEDIC      CTR, Rotator cuff repair   ??? PR ANESTH,SURGERY OF SHOULDER Right     Due to osteoarthritis & rotator cuff tendonopathy.     Family History   Problem Relation Age of Onset   ??? Arthritis-osteo Mother     ??? Heart Disease Mother    ??? Diabetes Mother    ??? Arthritis-rheumatoid Mother    ??? Hypertension Mother    ??? SLE Mother    ??? Coronary Artery Disease Mother    ??? Cancer Father    ??? Diabetes Father    ??? Hypertension Father    ??? Hypertension Sister    ??? Heart Disease Brother    ??? Hypertension Brother    ??? Diabetes Brother    ??? Coronary Artery Disease Brother    ??? Heart Disease Brother    ??? Hypertension Brother    ??? Diabetes Brother    ??? Coronary Artery Disease Brother      Social History     Social History   ??? Marital status: MARRIED     Spouse name: N/A   ??? Number of children: N/A   ??? Years of education: N/A     Social History Main Topics   ??? Smoking status: Never Smoker   ??? Smokeless tobacco: Never Used   ??? Alcohol use 0.0 oz/week     0 Standard drinks or equivalent per week      Comment: social   ??? Drug use: No   ??? Sexual activity: Yes     Partners: Male     Birth control/ protection: None     Other Topics Concern   ??? None     Social History Narrative     Physical Examination:   General appearance - alert, well appearing, and in no distress   Skin - normal coloration and turgor, no rashes   Mental status - alert, oriented to person, place, and time   HEENT - NC, AT   Neck - supple, no thyromegaly   Lungs - clear to auscultation, no wheezes, rales or  rhonchi   Breasts - no adenopathy, dimpling, or skin retractions noted, no discharge and non-tender   Heart - normal rate, regular rhythm, normal S1, S2, no murmurs, rubs, clicks or gallops   Abdomen - soft, non-tender, + BS, negative hepatosplenomegaly   Extremities - without clubbing, cyanosis or edema   Neurological - alert, oriented x3   Musculoskeletal - no joint tenderness, deformity or swelling   Pelvic -     EGBUS: WNL    VAGINA: without rugations and atrophic    CERVIX: atrophic and without lesion    UTERUS: AV, NSSC, Mobile, NT    ADNEXA: non-tender, no masses   Rectal - negative guaiac test   Pelvis - WNL   Inguinal Lymph Nodes - negative     Exam assisted by Jiles Garter MA-C    Diagnostic Test Results:      Results for orders placed or performed in visit on 02/01/17   AMB POC FECAL BLOOD, OCCULT, QL 3 CARDS   Result Value Ref Range    VALID INTERNAL CONTROL POC No     Hemoccult (POC) Negative Negative    Occult Blood-2 (POC) Other     Occult blood-3 (POC) Other          Decision Making (PLAN):    ICD-10-CM ICD-9-CM    1. Encounter for routine gynecological examination with Papanicolaou smear of cervix Z01.419 V72.31 PAP, LB, RFX HPV [id number])     V76.2    2. Screening breast examination Z12.31 V76.10    3. Screening for malignant neoplasm of the rectum Z12.12 V76.41 AMB POC FECAL BLOOD, OCCULT, QL 3 CARDS   4. Atrophic vaginitis N95.2 627.3      Orders Placed This Encounter   ??? AMB FECAL OCCULT BLOOD QL-3 CARDS   ??? PAP, LB, RFX HPV [id number])       Other Notes:55 y/o for annual exam  ?? Gyn exam/pap  ?? Mammogram scheduled  ?? Stool guaiac negative. Last colonoscopy in 2015 negative  ?? Follow up in 1 year for annual exam and prn new concerns.   ?? Further recommendations pending patient clinical course  ?? The above was discussed in detail with the patient who voices understanding and is in agreement to the proposed plan  ?? At least 15 minutes were spent with the patient where more than 50% of time was spent counseling the patient     Follow-up Disposition:  Return in about 1 year (around 02/01/2018).  Crista Luria, NP  02/01/2017 8:32 AM

## 2017-02-01 NOTE — Patient Instructions (Signed)
[facility]  Office working hours are Monday - Thursday 8:00 am to 6:00 pm.  Friday 8:00 am to 4:30 pm.  Office phone number is [phone] and fax is [phone].  For non-emergent medical care and clinical advice during office hours:   1. Call office or   2.  Send message or request using MyChart  For non-emergent medical care and clinical advice after office hours:  1.  Call [phone]  2.  Send message or request using MyChart  Emergency care can be obtained at the [facility] ER, Urgent Care or calling 911.    Patient Satisfaction Survey  We appreciate you giving us your e-mail address.  Please watch for our patient satisfaction survey which you will receive by e-mail.  We strive to provide you with the best care possible.  We respect all comments and will take comments into consideration to improve our service.  Thank you for your participation.    As a valued patient, you will be receiving a survey from [employer]. We encourage you to share your thoughts and opinions about the care you received today. Thank you for choosing [facility].

## 2017-02-04 LAB — PAP, LB, RFX HPV ASCUS(507301)

## 2017-02-04 NOTE — Progress Notes (Signed)
Please notify patient via letter of negative pap

## 2017-02-22 ENCOUNTER — Inpatient Hospital Stay: Admit: 2017-02-22 | Payer: BLUE CROSS/BLUE SHIELD | Attending: Obstetrics & Gynecology | Primary: Internal Medicine

## 2017-02-22 ENCOUNTER — Encounter

## 2017-02-22 DIAGNOSIS — Z1231 Encounter for screening mammogram for malignant neoplasm of breast: Secondary | ICD-10-CM

## 2017-03-03 ENCOUNTER — Encounter: Primary: Internal Medicine

## 2018-01-25 ENCOUNTER — Encounter

## 2018-01-26 ENCOUNTER — Ambulatory Visit: Admit: 2018-01-26 | Discharge: 2018-01-26 | Payer: PRIVATE HEALTH INSURANCE | Primary: Internal Medicine

## 2018-01-26 DIAGNOSIS — G8929 Other chronic pain: Secondary | ICD-10-CM

## 2018-01-26 NOTE — Progress Notes (Signed)
[facility]  Dr. Elta Guadeloupe, MD  [address], [place] [address]  Phone:  [phone]  Fax:  [phone]    Name:  Rachael Brown  DOB:  1961/05/30  Age:  57 y.o.    Encounter Date:  01/26/2018    PCP:  Darron Doom, MD    Chief Complaint   Patient presents with   ??? New Patient   ??? Joint Pain     Hips, Knees, Wrists and Right Elbow.   ??? Fibromyalgia     " Out of Control"   Tender to touch certain area's.   Cant get comfortable whether Standing,Sitting, Laying down etc.   ??? Foot Pain     "Feels like standing on Pins/needles"    Numbness/ Burning   ??? Sleep Problem     Up multiple times in the Night.   ??? Other     Problems holding onto Cups, Writting etc   ??? Dizziness     "Balance seems off"   ??? Myalgia     Having alot of Muscle Spasms   ??? Other     Sharp Pain in Hip and Knees   ??? Anxiety     Had Dry Neeedling with alittle bit of relief       HPI:  Rachael Brown is a 57 y.o. female who presents today for pain all over her body especially on the wrists, right elbow, hips and knees, none of these joints have been warm, red or swollen with morning stiffness that lasts about 30 minutes.    She is very well known for having fibromyalgia, the last time she was seen was in October 2016 by Dr. Kandice Robinsons and back then she was taking Elavil and tizanidine. Lyrica, Cymbalta and Savella can cause swelling of the lips and mild problem with breathing    Non-refreshing sleep and wakes up multiple times during the night, cognitive problems and fatigue.    Neck pain, constant, 4-7/10, sharp/achy, worsened with activities and improves with resting and there is no radiation.  She did physical therapy and try acupuncture and it seems like it may work.    Back pain, constant, 4-7/10, achy, gets worse with activities and with long periods of standing up and improves with resting.  There is radiation to both feet; no signs of cauda equina such as urinary or fecal incontinence as well as loss of anesthesia.     Previous evaluation performed in October 2016 with a negative rheumatoid factor, anti-CCP antibody as well as ANA and normal ESR and CRP.    Currently on gabapentin 100 mg 3 times per day, meloxicam 15 mg daily and tizanidine 4 mg every 8 hours and 16 mg qhs     There is history of 2 brothers and her mother with rheumatoid arthritis.  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  She had a fall in July of last year due to abnormal sensation in the lower extremities.      Allergies   Allergen Reactions   ??? Codeine Unknown (comments)   ??? Duloxetine Other (comments)     Side effects   ??? Percocet [Oxycodone-Acetaminophen] Unknown (comments)   ??? Pregabalin Other (comments)     Side effects from the medication   ??? Savella [Milnacipran] Other (comments)     Past Medical History:   Diagnosis Date   ??? Bilateral carpal tunnel syndrome 08/06/2014   ??? Carpal tunnel syndrome    ???  DDD (degenerative disc disease)    ??? Fibromyalgia    ??? Fibromyalgia 10/04/2014   ??? Intervertebral disc disease 08/06/2014   ??? Menopause    ??? Migraine    ??? Neuropathy    ??? OA (osteoarthritis)    ??? Osteoarthritis      Past Surgical History:   Procedure Laterality Date   ??? HX BACK SURGERY  2012   ??? HX CARPAL TUNNEL RELEASE Right    ??? HX DILATION AND CURETTAGE      & hysteroscopy due to uterine polyps   ??? HX HEENT      bilat eye   ??? HX ORTHOPAEDIC      CTR, Rotator cuff repair   ??? PR ANESTH,SURGERY OF SHOULDER Right     Due to osteoarthritis & rotator cuff tendonopathy.     Social History     Socioeconomic History   ??? Marital status: MARRIED     Spouse name: Not on file   ??? Number of children: Not on file   ??? Years of education: Not on file   ??? Highest education level: Not on file   Tobacco Use   ??? Smoking status: Never Smoker   ??? Smokeless tobacco: Never Used   Substance and Sexual Activity   ??? Alcohol use: Yes     Alcohol/week: 0.0 oz     Comment: social   ??? Drug use: No   ??? Sexual activity: Yes     Partners: Male      Birth control/protection: None     Family History   Problem Relation Age of Onset   ??? Arthritis-osteo Mother    ??? Heart Disease Mother    ??? Diabetes Mother    ??? Arthritis-rheumatoid Mother    ??? Hypertension Mother    ??? SLE Mother    ??? Coronary Artery Disease Mother    ??? Cancer Father    ??? Diabetes Father    ??? Hypertension Father    ??? Hypertension Sister    ??? Heart Disease Brother    ??? Hypertension Brother    ??? Diabetes Brother    ??? Coronary Artery Disease Brother    ??? Heart Disease Brother    ??? Hypertension Brother    ??? Diabetes Brother    ??? Coronary Artery Disease Brother        Current Outpatient Medications   Medication Sig Dispense Refill   ??? meloxicam (MOBIC) 15 mg tablet TAKE 1 TABLET BY MOUTH EVERY DAY  3   ??? tiZANidine (ZANAFLEX) 4 mg tablet Take 4 mg by mouth. Indications: 1 Tablet every 8 hours PRN and 4 tablets @ Bedtime     ??? gabapentin (NEURONTIN) 100 mg capsule Take  by mouth three (3) times daily.     ??? melatonin tab tablet Take 10 mg by mouth nightly.     ??? baclofen (LIORESAL) 10 mg tablet 1-2 tabs at bedtime 180 Tab 1   ??? diclofenac (VOLTAREN) 1 % gel Apply 4 g to affected area four (4) times daily. Dispense 5 tubes,100 gram each,apply 4 grams QID 1500 g 1   ??? lidocaine (LIDODERM) 5 % Apply patch to the affected area for 12 hours a day and remove for 12 hours a day. 3 Package 2   ??? aspirin 81 mg tablet Take 81 mg by mouth.     ??? promethazine (PHENERGAN) 6.25 mg/5 mL syrup   3       ROS:  Review of Systems  Constitutional: Positive for malaise/fatigue. Negative for chills, diaphoresis, fever and weight loss.   HENT: Negative.  Negative for congestion, ear discharge, ear pain, hearing loss, nosebleeds, sinus pain, sore throat and tinnitus.    Eyes: Negative.  Negative for blurred vision, double vision, photophobia, pain, discharge and redness.   Respiratory: Negative.  Negative for cough, hemoptysis, sputum production, shortness of breath, wheezing and stridor.     Cardiovascular: Negative.  Negative for chest pain, palpitations, orthopnea, claudication, leg swelling and PND.   Gastrointestinal: Negative.  Negative for abdominal pain, blood in stool, constipation, diarrhea, heartburn, melena, nausea and vomiting.   Genitourinary: Negative.  Negative for dysuria, flank pain, frequency, hematuria and urgency.   Musculoskeletal: Positive for back pain, falls, joint pain and neck pain.   Skin: Negative.  Negative for itching and rash.   Neurological: Negative for dizziness, tingling, tremors, sensory change, speech change, focal weakness, seizures, loss of consciousness, weakness and headaches.   Endo/Heme/Allergies: Negative.  Negative for environmental allergies. Does not bruise/bleed easily.   Psychiatric/Behavioral: Negative.  Negative for depression, hallucinations, memory loss, substance abuse and suicidal ideas. The patient is not nervous/anxious and does not have insomnia.        Visit Vitals  BP 136/88   Pulse 85   Temp 98.7 ??F (37.1 ??C) (Oral)   Resp 16   Ht '5\' 2"'  (1.575 m)   Wt 168 lb (76.2 kg)   SpO2 96%   BMI 30.73 kg/m??     Physical Exam:  Physical Exam   Constitutional: She is oriented to person, place, and time. She appears well-developed and well-nourished.   HENT:   Head: Normocephalic and atraumatic.   Right Ear: External ear normal.   Left Ear: External ear normal.   Mouth/Throat: Oropharynx is clear and moist.   Eyes: Pupils are equal, round, and reactive to light. Conjunctivae and EOM are normal.   Neck: Normal range of motion. Neck supple. No JVD present. No tracheal deviation present. No thyromegaly present.   Cardiovascular: Normal rate, normal heart sounds and intact distal pulses.   Abdominal: Soft. Bowel sounds are normal. She exhibits no distension and no mass. There is no tenderness. There is no rebound and no guarding.   Musculoskeletal: Normal range of motion. She exhibits no edema or tenderness.    Bilateral Heberden's and Bouchard nodules, pain to palpation on the MCP, PIP as well as the MTP joints but there is no synovitis.  There is positive Tinel and Phalen maneuver bilaterally, 18 out of 18 tender points of fibromyalgia.  There is bilateral pain to palpation in both plantar fasciae and Achilles tendons   Lymphadenopathy:     She has no cervical adenopathy.   Neurological: She is alert and oriented to person, place, and time. She has normal reflexes.   Negative straight leg raise test, normal patellar reflexes and normal evaluation of L5-S1.   Skin: Skin is warm.   Psychiatric: She has a normal mood and affect. Her behavior is normal. Judgment and thought content normal.   Nursing note and vitals reviewed.      Review of Additional Data:  Office notes, Nursing Notes, Vital Signs, Radiology Reports, Lab Reports, Xray Reports, Historical Medical Records    Assessment/Plan:     Arthralgias affecting mainly the hands and feet, it seems to be a combination of osteoarthritis, carpal tunnel syndrome and fibromyalgia.  Previous evaluation for rheumatoid arthritis and lupus were negative with normal ESR and CRP.  Recommended to continue with  meloxicam 50 mg daily.    Right lateral hand osteoarthritis, recommended to be gentle with her hands, avoid squeezing, twisting, turning hard as well as using an electric can opener.    Bilateral carpal tunnel syndrome, status post surgery, symptomatic, recommended to wear the cock-up splints bilaterally.    Neck pain, it seems to be a combination of degenerative disc disease (status post surgery) and fibromyalgia.  Recommended to continue with meloxicam 50 mg daily as well as tizanidine 4 mg every 8 hours and 60 mg nightly.    Lower back pain, there are no signs of cauda equina or radiculopathy.  Recommended to continue with the above medications.    Fibromyalgia, symptomatic, recommended to continue with gabapentin 100 mg  3 times per day which was recently started, she cannot tolerate SSRI such as Cymbalta or Savella as well as she cannot tolerate Lyrica.  Gabapentin was started recently.  Recommended to continue with meloxicam and tizanidine.  We spoke about the healthy diet, gluten-free/vegan as well as a low impact exercise program.  Recommended topical cream like Aspercreme, Biofreeze or BenGay as well as heating pads.    Symptoms of small fiber neuropathy, she may have a nerve conduction study hopefully gabapentin will provide some relief of the numbness, tingling and burning sensation.              ICD-10-CM ICD-9-CM    1. Chronic bilateral low back pain with left-sided sciatica M54.42 724.2     G89.29 724.3      338.29    2. Fibromyalgia M79.7 729.1    3. Bilateral carpal tunnel syndrome G56.03 354.0    4. H/O cervical spine surgery Z98.890 V45.89    5. Localized osteoarthritis of hands, bilateral M19.041 715.34     M19.042     6. Pain in both hands M79.641 729.5     M79.642     7. Chronic pain of toes of both feet M79.674 729.5     M79.675 338.29     G89.29     8. Small fiber neuropathy G62.9 356.9    9. Bilateral plantar fasciitis M72.2 728.71    10. Achilles tendinitis of both lower extremities M76.61 726.71     M76.62       No orders of the defined types were placed in this encounter.        Follow-up and Dispositions    ?? Return in about 1 month (around 02/25/2018).           Electronically Signed by:    Cedric Fishman, MD

## 2018-02-02 ENCOUNTER — Encounter: Attending: Family | Primary: Internal Medicine

## 2018-02-03 ENCOUNTER — Ambulatory Visit
Admit: 2018-02-03 | Discharge: 2018-02-03 | Payer: PRIVATE HEALTH INSURANCE | Attending: Family | Primary: Internal Medicine

## 2018-02-03 ENCOUNTER — Inpatient Hospital Stay: Admit: 2018-02-04 | Payer: BLUE CROSS/BLUE SHIELD | Primary: Internal Medicine

## 2018-02-03 DIAGNOSIS — Z01419 Encounter for gynecological examination (general) (routine) without abnormal findings: Secondary | ICD-10-CM

## 2018-02-03 NOTE — Progress Notes (Signed)
Please notify patient via letter of negative pap

## 2018-02-03 NOTE — Patient Instructions (Signed)
[facility]  Office working hours are Monday - Thursday 8:00 am to 6:00 pm.  Friday 8:00 am to 4:30 pm.  Office phone number is [phone] and fax is [phone].  For non-emergent medical care and clinical advice during office hours:   1. Call office or   2.  Send message or request using MyChart  For non-emergent medical care and clinical advice after office hours:  1.  Call [phone]  2.  Send message or request using MyChart  Emergency care can be obtained at the [facility] ER, Urgent Care or calling 911.    Patient Satisfaction Survey  We appreciate you giving us your e-mail address.  Please watch for our patient satisfaction survey which you will receive by e-mail.  We strive to provide you with the best care possible.  We respect all comments and will take comments into consideration to improve our service.  Thank you for your participation.    As a valued patient, you will be receiving a survey from [employer]. We encourage you to share your thoughts and opinions about the care you received today. Thank you for choosing [facility].

## 2018-02-03 NOTE — Progress Notes (Signed)
[facility]         Phone:  [phone]         Fax:  [phone]      Patient:  Rachael Brown DOB:  [birth year]  MRN:  [id number]  D.O.S.:  02/03/2018    Primary Care Provider:  Nani Skillern, MD    Chief Complaint   Patient presents with   ??? Annual Exam     History of Present Illness:  Rachael Brown, a 57 y.o. female is here today for an annual exam. Patient denies any complaints. Pt had colonoscopy in August 2018. She is on an every 3 year schedule due to family hx of colon cancer.     Obstetric History:  OB History     Gravida   0    Para   0    Term   0    Preterm   0    AB   0    Living   0       SAB   0    TAB   0    Ectopic   0    Molar        Multiple   0    Live Births                  GYN History:   Menstrual History  Period cycle: Post Menopausal              Mammogram/Pap  Mammogram Date: 02/22/17  Mammogram Results: Normal  Pap Date: 02/01/17  Pap Results: Normal         Breast Self-Exam:  YES    Past Medical History:  Past Medical History:   Diagnosis Date   ??? Bilateral carpal tunnel syndrome 08/06/2014   ??? Carpal tunnel syndrome    ??? DDD (degenerative disc disease)    ??? Fibromyalgia    ??? Fibromyalgia 10/04/2014   ??? Intervertebral disc disease 08/06/2014   ??? Menopause    ??? Migraine    ??? Neuropathy    ??? OA (osteoarthritis)    ??? Osteoarthritis      Past Surgical History:  Past Surgical History:   Procedure Laterality Date   ??? HX BACK SURGERY  2012   ??? HX CARPAL TUNNEL RELEASE Right    ??? HX DILATION AND CURETTAGE      & hysteroscopy due to uterine polyps   ??? HX HEENT      bilat eye   ??? HX ORTHOPAEDIC      CTR, Rotator cuff repair   ??? PR ANESTH,SURGERY OF SHOULDER Right     Due to osteoarthritis & rotator cuff tendonopathy.     Social History:  Social History     Socioeconomic History   ??? Marital status: MARRIED     Spouse name: Not on file   ??? Number of children: Not on file   ??? Years of education: Not on file   ??? Highest education level: Not on file   Tobacco Use    ??? Smoking status: Never Smoker   ??? Smokeless tobacco: Never Used   Substance and Sexual Activity   ??? Alcohol use: Yes     Alcohol/week: 0.0 oz     Comment: social   ??? Drug use: No   ??? Sexual activity: Yes     Partners: Male     Birth control/protection: None     Medications:  Outpatient Medications Marked as Taking  for the 02/03/18 encounter (Office Visit) with Dominic Pea, Stann Mainland, NP   Medication Sig Dispense Refill   ??? meloxicam (MOBIC) 15 mg tablet TAKE 1 TABLET BY MOUTH EVERY DAY  3   ??? tiZANidine (ZANAFLEX) 4 mg tablet Take 4 mg by mouth. Indications: 1 Tablet every 8 hours PRN and 4 tablets @ Bedtime     ??? gabapentin (NEURONTIN) 100 mg capsule Take  by mouth three (3) times daily.     ??? melatonin tab tablet Take 10 mg by mouth nightly.     ??? baclofen (LIORESAL) 10 mg tablet 1-2 tabs at bedtime 180 Tab 1   ??? promethazine (PHENERGAN) 6.25 mg/5 mL syrup   3   ??? diclofenac (VOLTAREN) 1 % gel Apply 4 g to affected area four (4) times daily. Dispense 5 tubes,100 gram each,apply 4 grams QID 1500 g 1   ??? lidocaine (LIDODERM) 5 % Apply patch to the affected area for 12 hours a day and remove for 12 hours a day. 3 Package 2   ??? aspirin 81 mg tablet Take 81 mg by mouth.       Allergies:  Allergies   Allergen Reactions   ??? Codeine Unknown (comments)   ??? Duloxetine Other (comments)     Side effects   ??? Percocet [Oxycodone-Acetaminophen] Unknown (comments)   ??? Pregabalin Other (comments)     Side effects from the medication   ??? Savella [Milnacipran] Other (comments)     Family History:  Family History   Problem Relation Age of Onset   ??? Arthritis-osteo Mother    ??? Heart Disease Mother    ??? Diabetes Mother    ??? Arthritis-rheumatoid Mother    ??? Hypertension Mother    ??? SLE Mother    ??? Coronary Artery Disease Mother    ??? Cancer Father    ??? Diabetes Father    ??? Hypertension Father    ??? Hypertension Sister    ??? Heart Disease Brother    ??? Hypertension Brother    ??? Diabetes Brother    ??? Coronary Artery Disease Brother     ??? Heart Disease Brother    ??? Hypertension Brother    ??? Diabetes Brother    ??? Coronary Artery Disease Brother        Review of Systems:   General:  no fever, chills, or body aches   Skin:  no hives, itching or change in skin color   Eyes:  no blurred vision, eye pain, or discharge   Ears:  no ear pain, discharge, or difficulty hearing   Nose: no nasal congestion, discharge, or bleeding   Mouth:  no sore throat or difficulty swallowing   Neck:  no pain or swelling   Respiratory:  no shortness of breath, cough, wheezing, or bloody sputum   Cardiac:  no chest pains or palpitations   G/I:  no nausea, vomiting, diarrhea, constipation, or abdominal pain   Musculo-Skeletal:  no joint or muscle pain, no back pain   G/U:  no dysuria, frequency of urination, or slow stream   Neuro:  no slurred speech, seizures, or localized weakness   Other Systems:  WNL    Physical Examination:  Vitals:    02/03/18 1402   BP: 139/77   Pulse: 88   Weight: 164 lb (74.4 kg)   Height:  (1.575 m)   PainSc:   0 - No pain      General appearance - alert, well appearing, and in  no distress   Skin - normal coloration and turgor, no rashes   Mental status - alert, oriented to person, place, and time   Eyes - pupils equal and reactive, extraocular eye movements intact   ENT - WNL   Neck - supple, no significant adenopathy   Lungs - clear to auscultation, no wheezes, rales or rhonchi, symmetric air entry   Breasts - no adenopathy, dimpling, or skin retractions noted, no discharge, non-tender and firm, mobile cystic lesion, right breast at 5 o'clock position, near areola   Heart - normal rate, regular rhythm, normal S1, S2, no murmurs, rubs, clicks or gallops   Abdomen - soft, non-tender, positive bowel sounds   Extremities - without clubbing, cyanosis or edema   Neurological - alert, oriented x3   Musculoskeletal - no joint tenderness, deformity or swelling   Pelvic -     EGBUS: WNL    VAGINA: grade 2 cystocele and atrophic     CERVIX: without lesion    UTERUS: AV, NSSC, Mobile, NT    ADNEXA: non-tender, no masses    Diagnostic Test Results:      Assessment/Plan:    ICD-10-CM ICD-9-CM    1. Encounter for routine gynecological examination with Papanicolaou smear of cervix Z01.419 V72.31 PAP, LB, RFX HPV [id number]([id number])     V76.2    2. Breast lump N63.0 611.72 MAM 3D TOMO W MAMMO BI DX INCL CAD      US BREAST RT LIMITED=<3 QUAD      CANCELED: US BREAST LT LIMITED=<3 QUAD     Follow-up and Dispositions    Return in about 1 year (around 02/04/2019).     ?? gyn exam/pap  ?? Scheduled diagnostic mammogram/us due to new right breast lump  ?? Colonoscopy up to date  ?? Follow up for annual exam and prn new concerns  ?? The above was discussed in detail with the patient who voices understanding and is in agreement to the proposed plan  ?? Further recommendations pending patient clinical course  ?? At least 15 minutes were spent with the patient where more than 50 % was spent in counseling    Crista Luria, NP  02/03/2018 2:57 PM

## 2018-02-08 ENCOUNTER — Inpatient Hospital Stay: Admit: 2018-02-08 | Payer: BLUE CROSS/BLUE SHIELD | Attending: Family | Primary: Internal Medicine

## 2018-02-08 DIAGNOSIS — N63 Unspecified lump in unspecified breast: Secondary | ICD-10-CM

## 2018-02-09 LAB — PAP, LB, RFX HPV ASCUS(507301)

## 2018-02-09 NOTE — Progress Notes (Signed)
Let pt know her mammogram and [place] is negative and recommendation per mammography reading radiologist is for her to go back to yearly screening. Come in sooner if she has new concerns.

## 2018-02-09 NOTE — Progress Notes (Signed)
Spoke with Rachael Brown and gave info. Rachael Brown. VU.

## 2018-02-14 NOTE — Telephone Encounter (Signed)
02/14/2018--Spoke with pt and she stated that she already has an appointment with her PCM and will take to them in regards to getting refills---Rachael Brown Chestine Spore

## 2018-02-14 NOTE — Telephone Encounter (Signed)
Let pt know I have not prescribed this medication for her ever. She will need to discuss with her pcp or prescribing provider.

## 2018-02-14 NOTE — Telephone Encounter (Signed)
Needs a script for liquid Phenergan to [facility], call back number [phone]

## 2018-02-23 ENCOUNTER — Ambulatory Visit: Primary: Internal Medicine

## 2018-02-23 ENCOUNTER — Ambulatory Visit: Admit: 2018-02-23 | Discharge: 2018-02-23 | Payer: PRIVATE HEALTH INSURANCE | Primary: Internal Medicine

## 2018-02-23 DIAGNOSIS — M797 Fibromyalgia: Secondary | ICD-10-CM

## 2018-02-23 MED ORDER — GABAPENTIN 100 MG CAP
100 mg | ORAL_CAPSULE | Freq: Three times a day (TID) | ORAL | 3 refills | Status: DC
Start: 2018-02-23 — End: 2018-05-11

## 2018-02-23 NOTE — Progress Notes (Signed)
[facility]  Dr. Elta Guadeloupe, MD  [address] [place] [address]  Phone:  [phone]  Fax:  [phone]    Name:  Rachael Brown  DOB:  March 28, 1961  Age:  57 y.o.    Encounter Date:  02/23/2018    PCP:  Darron Doom, MD    Chief Complaint   Patient presents with   ??? Follow-up     Labs   ??? Joint Pain     Across Neck and Shoulders and down the Arms   ??? Headache     Pt keeps "nagging HA "that wont go away.  Makes her feel  [place]   ??? Hip Pain     Right - down Leg into her Foot.   ??? Sleep Problem   ??? Other     Finished PT on Neck but Pain is still there.       HPI:  Rachael Brown is a 57 y.o. female who presents today for evaluation of arthralgias affecting mainly the hands and feet, it seems to be a combination of osteoarthritis, carpal tunnel syndrome and fibromyalgia.  Previous evaluation for rheumatoid arthritis and lupus were negative with normal ESR and CRP.  Recommended to continue with meloxicam 15 mg daily.  ??  Right lateral hand osteoarthritis, has been gentle with her hands   ??  Bilateral carpal tunnel syndrome, status post surgery, symptomatic, recommended to wear the cock-up splints bilaterally.  ??  Neck pain, she finished PT and pain persisted.   ??  Lower back pain, there are no signs of cauda equina or radiculopathy.  Recommended to continue with the above medications.  ??  Fibromyalgia, symptomatic, recommended to continue with gabapentin 100 mg 3 times per day which was recently started, she cannot tolerate SSRI such as Cymbalta or Savella as well as she cannot tolerate Lyrica.  Gabapentin was started recently.  Recommended to continue with meloxicam and tizanidine.    ??  Symptoms of small fiber neuropathy, she may have a nerve conduction study hopefully gabapentin will provide some relief of the numbness, tingling and burning sensation.          Allergies   Allergen Reactions   ??? Codeine Unknown (comments)   ??? Duloxetine Other (comments)     Side effects   ??? Percocet  [Oxycodone-Acetaminophen] Unknown (comments)   ??? Pregabalin Other (comments)     Side effects from the medication   ??? Savella [Milnacipran] Other (comments)     Past Medical History:   Diagnosis Date   ??? Bilateral carpal tunnel syndrome 08/06/2014   ??? Carpal tunnel syndrome    ??? DDD (degenerative disc disease)    ??? Fibromyalgia    ??? Fibromyalgia 10/04/2014   ??? Intervertebral disc disease 08/06/2014   ??? Menopause    ??? Migraine    ??? Neuropathy    ??? OA (osteoarthritis)    ??? Osteoarthritis      Past Surgical History:   Procedure Laterality Date   ??? HX BACK SURGERY  2012   ??? HX CARPAL TUNNEL RELEASE Right    ??? HX DILATION AND CURETTAGE      & hysteroscopy due to uterine polyps   ??? HX HEENT      bilat eye   ??? HX ORTHOPAEDIC      CTR, Rotator cuff repair   ??? PR ANESTH,SURGERY OF SHOULDER Right     Due to osteoarthritis & rotator cuff tendonopathy.     Social  History     Socioeconomic History   ??? Marital status: MARRIED     Spouse name: Not on file   ??? Number of children: Not on file   ??? Years of education: Not on file   ??? Highest education level: Not on file   Tobacco Use   ??? Smoking status: Never Smoker   ??? Smokeless tobacco: Never Used   Substance and Sexual Activity   ??? Alcohol use: Yes     Alcohol/week: 0.0 oz     Comment: social   ??? Drug use: No   ??? Sexual activity: Yes     Partners: Male     Birth control/protection: None     Family History   Problem Relation Age of Onset   ??? Arthritis-osteo Mother    ??? Heart Disease Mother    ??? Diabetes Mother    ??? Arthritis-rheumatoid Mother    ??? Hypertension Mother    ??? SLE Mother    ??? Coronary Artery Disease Mother    ??? Cancer Father    ??? Diabetes Father    ??? Hypertension Father    ??? Hypertension Sister    ??? Heart Disease Brother    ??? Hypertension Brother    ??? Diabetes Brother    ??? Coronary Artery Disease Brother    ??? Heart Disease Brother    ??? Hypertension Brother    ??? Diabetes Brother    ??? Coronary Artery Disease Brother        Current Outpatient Medications   Medication Sig  Dispense Refill   ??? cetirizine (ZYRTEC) 10 mg tablet      ??? raNITIdine (ZANTAC) 150 mg tablet      ??? gabapentin (NEURONTIN) 100 mg capsule Take 2 Caps by mouth three (3) times daily. Max Daily Amount: 600 mg. 180 Cap 3   ??? meloxicam (MOBIC) 15 mg tablet TAKE 1 TABLET BY MOUTH EVERY DAY  3   ??? tiZANidine (ZANAFLEX) 4 mg tablet Take 4 mg by mouth. Indications: 1 Tablet every 8 hours PRN and 4 tablets @ Bedtime     ??? melatonin tab tablet Take 10 mg by mouth nightly.     ??? baclofen (LIORESAL) 10 mg tablet 1-2 tabs at bedtime 180 Tab 1   ??? promethazine (PHENERGAN) 6.25 mg/5 mL syrup   3   ??? diclofenac (VOLTAREN) 1 % gel Apply 4 g to affected area four (4) times daily. Dispense 5 tubes,100 gram each,apply 4 grams QID 1500 g 1   ??? lidocaine (LIDODERM) 5 % Apply patch to the affected area for 12 hours a day and remove for 12 hours a day. 3 Package 2   ??? aspirin 81 mg tablet Take 81 mg by mouth.         ROS:  Review of Systems   Constitutional: Positive for malaise/fatigue. Negative for chills, diaphoresis, fever and weight loss.   HENT: Negative.  Negative for congestion, ear discharge, ear pain, hearing loss, nosebleeds, sinus pain, sore throat and tinnitus.    Eyes: Negative.  Negative for blurred vision, double vision, photophobia, pain, discharge and redness.   Respiratory: Negative.  Negative for cough, hemoptysis, sputum production, shortness of breath, wheezing and stridor.    Cardiovascular: Negative.  Negative for chest pain, palpitations, orthopnea, claudication, leg swelling and PND.   Gastrointestinal: Negative.  Negative for abdominal pain, blood in stool, constipation, diarrhea, heartburn, melena, nausea and vomiting.   Genitourinary: Negative.  Negative for dysuria, flank pain, frequency, hematuria  and urgency.   Musculoskeletal: Positive for back pain, joint pain and neck pain.   Skin: Negative.  Negative for itching and rash.   Neurological: Negative for dizziness, tingling, tremors, sensory change, speech  change, focal weakness, seizures, loss of consciousness, weakness and headaches.   Endo/Heme/Allergies: Negative.  Negative for environmental allergies. Does not bruise/bleed easily.   Psychiatric/Behavioral: Negative.  Negative for depression, hallucinations, memory loss, substance abuse and suicidal ideas. The patient is not nervous/anxious and does not have insomnia.        Visit Vitals  BP 116/84   Pulse 76   Temp 98.7 ??F (37.1 ??C) (Oral)   Resp 16   Ht 5' 2"  (1.575 m)   Wt 168 lb (76.2 kg)   SpO2 97%   BMI 30.73 kg/m??     Physical Exam:  Physical Exam   Constitutional: She is oriented to person, place, and time. She appears well-developed and well-nourished.   HENT:   Head: Normocephalic and atraumatic.   Right Ear: External ear normal.   Left Ear: External ear normal.   Mouth/Throat: Oropharynx is clear and moist.   Eyes: Pupils are equal, round, and reactive to light. Conjunctivae and EOM are normal.   Neck: Normal range of motion. Neck supple. No JVD present. No tracheal deviation present. No thyromegaly present.   Cardiovascular: Normal rate, normal heart sounds and intact distal pulses.   Abdominal: Soft. Bowel sounds are normal. She exhibits no distension and no mass. There is no tenderness. There is no rebound and no guarding.   Musculoskeletal: Normal range of motion. She exhibits no edema or tenderness.   Lateral Heberden's and Bouchard nodules, pain to palpation on the MCP, PIP as well as MTP joints but there is no synovitis.  There is bilateral Tinel and Phalen maneuver.  Negative Patrick and Gaenslen maneuver.   Lymphadenopathy:     She has no cervical adenopathy.   Neurological: She is alert and oriented to person, place, and time. She has normal reflexes.   Negative straight leg raise test, normal patellar reflexes and normal evaluation of the motor and sensitive branch of L5-S1.   Skin: Skin is warm.   Psychiatric: She has a normal mood and affect. Her behavior is normal. Judgment and thought  content normal.   Nursing note and vitals reviewed.      Review of Additional Data:  Office notes, Nursing Notes, Vital Signs, Radiology Reports, Lab Reports, Xray Reports, Historical Medical Records    Assessment/Plan:     Fibromyalgia, symptomatic, were going to increase the gabapentin to 200 mg 3 times per day, continue with tizanidine 4 mg 3 times per day and 8 mg at night as well as with meloxicam 15 mg daily.  We emphasized the importance of low impact exercise, healthy diet patient is applying for disability.  I explained that if she gets  disability, she needs to be very active.    Small fiber neuropathy symptoms, recently the patient had an EMG and it was completely normal, which is suspected in patients with small fiber neuropathy which diagnosis is through punch biopsy of the skin.  Gabapentin is going to be increased to 200 mg 3 times per day.    Bilateral carpal tunnel syndrome, she has been in the cock-up splints with some relief of the pain and the numbness and tingling of the hands and wrists.    Multiple site arthralgias, the work-up for an inflammatory arthropathy came back negative.  Normal ESR and CRP.  There is bilateral hand osteoarthritis, patient has been gentle as possible with her hands and we spoke about the paraffin box.    Neck/lower back pain, there are no signs of cauda equina radiculopathy, continue with meloxicam 15 mg daily, tizanidine 4 mg every 8 hours and 8 mg nightly and were going to increase the gabapentin to 200 mg 3 times a day.  Physical therapy provided some relief for the stiffness but no improvement of the pain.        ICD-10-CM ICD-9-CM    1. Fibromyalgia M79.7 729.1 gabapentin (NEURONTIN) 100 mg capsule   2. Chronic bilateral low back pain with left-sided sciatica M54.42 724.2     G89.29 724.3      338.29    3. Bilateral carpal tunnel syndrome G56.03 354.0    4. Arthritis involving multiple sites M12.9 716.99    5. Localized osteoarthritis of hands, bilateral M19.041  715.34     M19.042     6. Small fiber neuropathy G62.9 356.9 gabapentin (NEURONTIN) 100 mg capsule     Orders Placed This Encounter   ??? gabapentin (NEURONTIN) 100 mg capsule           Follow-up and Dispositions    ?? Return in about 6 weeks (around 04/06/2018).           Electronically Signed by:    Cedric Fishman, MD

## 2018-02-23 NOTE — Progress Notes (Signed)
[facility]  Dr. Elta Guadeloupe, MD  [address], [place] [address]  Phone:  [phone]  Fax:  [phone]    Name:  Rachael Brown  DOB:  05-17-1961  Age:  57 y.o.    Encounter Date:  02/23/2018    PCP:  Darron Doom, MD    Chief Complaint   Patient presents with   ??? Follow-up     Labs   ??? Joint Pain     Across Neck and Shoulders and down the Arms   ??? Headache     Pt keeps "nagging HA "that wont go away.  Makes her feel  [place]   ??? Hip Pain     Right - down Leg into her Foot.   ??? Sleep Problem   ??? Other     Finished PT on Neck but Pain is still there.       HPI:  Rachael Brown is a 57 y.o. female who presents today for evaluation of arthralgias affecting mainly the hands and feet, it seems to be a combination of osteoarthritis, carpal tunnel syndrome and fibromyalgia.  Previous evaluation for rheumatoid arthritis and lupus were negative with normal ESR and CRP.  Recommended to continue with meloxicam 15 mg daily.  ??  Right lateral hand osteoarthritis, has been gentle with her hands   ??  Bilateral carpal tunnel syndrome, status post surgery, symptomatic, recommended to wear the cock-up splints bilaterally.  ??  Neck pain, she finished PT and pain persisted.   ??  Lower back pain, there are no signs of cauda equina or radiculopathy.  Recommended to continue with the above medications.  ??  Fibromyalgia, symptomatic, recommended to continue with gabapentin 100 mg 3 times per day which was recently started, she cannot tolerate SSRI such as Cymbalta or Savella as well as she cannot tolerate Lyrica.  Gabapentin was started recently.  Recommended to continue with meloxicam and tizanidine.    ??  Symptoms of small fiber neuropathy, she may have a nerve conduction study hopefully gabapentin will provide some relief of the numbness, tingling and burning sensation.          Allergies   Allergen Reactions   ??? Codeine Unknown (comments)   ??? Duloxetine Other (comments)     Side effects    ??? Percocet [Oxycodone-Acetaminophen] Unknown (comments)   ??? Pregabalin Other (comments)     Side effects from the medication   ??? Savella [Milnacipran] Other (comments)     Past Medical History:   Diagnosis Date   ??? Bilateral carpal tunnel syndrome 08/06/2014   ??? Carpal tunnel syndrome    ??? DDD (degenerative disc disease)    ??? Fibromyalgia    ??? Fibromyalgia 10/04/2014   ??? Intervertebral disc disease 08/06/2014   ??? Menopause    ??? Migraine    ??? Neuropathy    ??? OA (osteoarthritis)    ??? Osteoarthritis      Past Surgical History:   Procedure Laterality Date   ??? HX BACK SURGERY  2012   ??? HX CARPAL TUNNEL RELEASE Right    ??? HX DILATION AND CURETTAGE      & hysteroscopy due to uterine polyps   ??? HX HEENT      bilat eye   ??? HX ORTHOPAEDIC      CTR, Rotator cuff repair   ??? PR ANESTH,SURGERY OF SHOULDER Right     Due to osteoarthritis & rotator cuff tendonopathy.     Social  History     Socioeconomic History   ??? Marital status: MARRIED     Spouse name: Not on file   ??? Number of children: Not on file   ??? Years of education: Not on file   ??? Highest education level: Not on file   Tobacco Use   ??? Smoking status: Never Smoker   ??? Smokeless tobacco: Never Used   Substance and Sexual Activity   ??? Alcohol use: Yes     Alcohol/week: 0.0 oz     Comment: social   ??? Drug use: No   ??? Sexual activity: Yes     Partners: Male     Birth control/protection: None     Family History   Problem Relation Age of Onset   ??? Arthritis-osteo Mother    ??? Heart Disease Mother    ??? Diabetes Mother    ??? Arthritis-rheumatoid Mother    ??? Hypertension Mother    ??? SLE Mother    ??? Coronary Artery Disease Mother    ??? Cancer Father    ??? Diabetes Father    ??? Hypertension Father    ??? Hypertension Sister    ??? Heart Disease Brother    ??? Hypertension Brother    ??? Diabetes Brother    ??? Coronary Artery Disease Brother    ??? Heart Disease Brother    ??? Hypertension Brother    ??? Diabetes Brother    ??? Coronary Artery Disease Brother        Current Outpatient Medications    Medication Sig Dispense Refill   ??? cetirizine (ZYRTEC) 10 mg tablet      ??? raNITIdine (ZANTAC) 150 mg tablet      ??? gabapentin (NEURONTIN) 100 mg capsule Take 2 Caps by mouth three (3) times daily. Max Daily Amount: 600 mg. 180 Cap 3   ??? meloxicam (MOBIC) 15 mg tablet TAKE 1 TABLET BY MOUTH EVERY DAY  3   ??? tiZANidine (ZANAFLEX) 4 mg tablet Take 4 mg by mouth. Indications: 1 Tablet every 8 hours PRN and 4 tablets @ Bedtime     ??? melatonin tab tablet Take 10 mg by mouth nightly.     ??? baclofen (LIORESAL) 10 mg tablet 1-2 tabs at bedtime 180 Tab 1   ??? promethazine (PHENERGAN) 6.25 mg/5 mL syrup   3   ??? diclofenac (VOLTAREN) 1 % gel Apply 4 g to affected area four (4) times daily. Dispense 5 tubes,100 gram each,apply 4 grams QID 1500 g 1   ??? lidocaine (LIDODERM) 5 % Apply patch to the affected area for 12 hours a day and remove for 12 hours a day. 3 Package 2   ??? aspirin 81 mg tablet Take 81 mg by mouth.         ROS:  Review of Systems   Constitutional: Positive for malaise/fatigue. Negative for chills, diaphoresis, fever and weight loss.   HENT: Negative.  Negative for congestion, ear discharge, ear pain, hearing loss, nosebleeds, sinus pain, sore throat and tinnitus.    Eyes: Negative.  Negative for blurred vision, double vision, photophobia, pain, discharge and redness.   Respiratory: Negative.  Negative for cough, hemoptysis, sputum production, shortness of breath, wheezing and stridor.    Cardiovascular: Negative.  Negative for chest pain, palpitations, orthopnea, claudication, leg swelling and PND.   Gastrointestinal: Negative.  Negative for abdominal pain, blood in stool, constipation, diarrhea, heartburn, melena, nausea and vomiting.   Genitourinary: Negative.  Negative for dysuria, flank pain, frequency, hematuria  and urgency.   Musculoskeletal: Positive for back pain, joint pain and neck pain.   Skin: Negative.  Negative for itching and rash.    Neurological: Negative for dizziness, tingling, tremors, sensory change, speech change, focal weakness, seizures, loss of consciousness, weakness and headaches.   Endo/Heme/Allergies: Negative.  Negative for environmental allergies. Does not bruise/bleed easily.   Psychiatric/Behavioral: Negative.  Negative for depression, hallucinations, memory loss, substance abuse and suicidal ideas. The patient is not nervous/anxious and does not have insomnia.        Visit Vitals  BP 116/84   Pulse 76   Temp 98.7 ??F (37.1 ??C) (Oral)   Resp 16   Ht _0  (1.575 m)   Wt 168 lb (76.2 kg)   SpO2 97%   BMI 30.73 kg/m??     Physical Exam:  Physical Exam   Constitutional: She is oriented to person, place, and time. She appears well-developed and well-nourished.   HENT:   Head: Normocephalic and atraumatic.   Right Ear: External ear normal.   Left Ear: External ear normal.   Mouth/Throat: Oropharynx is clear and moist.   Eyes: Pupils are equal, round, and reactive to light. Conjunctivae and EOM are normal.   Neck: Normal range of motion. Neck supple. No JVD present. No tracheal deviation present. No thyromegaly present.   Cardiovascular: Normal rate, normal heart sounds and intact distal pulses.   Abdominal: Soft. Bowel sounds are normal. She exhibits no distension and no mass. There is no tenderness. There is no rebound and no guarding.   Musculoskeletal: Normal range of motion. She exhibits no edema or tenderness.   Lateral Heberden's and Bouchard nodules, pain to palpation on the MCP, PIP as well as MTP joints but there is no synovitis.  There is bilateral Tinel and Phalen maneuver.  Negative Patrick and Gaenslen maneuver.   Lymphadenopathy:     She has no cervical adenopathy.   Neurological: She is alert and oriented to person, place, and time. She has normal reflexes.   Negative straight leg raise test, normal patellar reflexes and normal evaluation of the motor and sensitive branch of L5-S1.   Skin: Skin is warm.    Psychiatric: She has a normal mood and affect. Her behavior is normal. Judgment and thought content normal.   Nursing note and vitals reviewed.      Review of Additional Data:  Office notes, Nursing Notes, Vital Signs, Radiology Reports, Lab Reports, Xray Reports, Historical Medical Records    Assessment/Plan:     Fibromyalgia, symptomatic, were going to increase the gabapentin to 200 mg 3 times per day, continue with tizanidine 4 mg 3 times per day and 8 mg at night as well as with meloxicam 15 mg daily.  We emphasized the importance of low impact exercise, healthy diet patient is applying for disability.  I explained that if she gets  disability, she needs to be very active.    Small fiber neuropathy symptoms, recently the patient had an EMG and it was completely normal, which is suspected in patients with small fiber neuropathy which diagnosis is through punch biopsy of the skin.  Gabapentin is going to be increased to 200 mg 3 times per day.    Bilateral carpal tunnel syndrome, she has been in the cock-up splints with some relief of the pain and the numbness and tingling of the hands and wrists.    Multiple site arthralgias, the work-up for an inflammatory arthropathy came back negative.  Normal ESR and CRP.  There is bilateral hand osteoarthritis, patient has been gentle as possible with her hands and we spoke about the paraffin box.    Neck/lower back pain, there are no signs of cauda equina radiculopathy, continue with meloxicam 15 mg daily, tizanidine 4 mg every 8 hours and 8 mg nightly and were going to increase the gabapentin to 200 mg 3 times a day.  Physical therapy provided some relief for the stiffness but no improvement of the pain.        ICD-10-CM ICD-9-CM    1. Fibromyalgia M79.7 729.1 gabapentin (NEURONTIN) 100 mg capsule   2. Chronic bilateral low back pain with left-sided sciatica M54.42 724.2     G89.29 724.3      338.29    3. Bilateral carpal tunnel syndrome G56.03 354.0     4. Arthritis involving multiple sites M12.9 716.99    5. Localized osteoarthritis of hands, bilateral M19.041 715.34     M19.042     6. Small fiber neuropathy G62.9 356.9 gabapentin (NEURONTIN) 100 mg capsule     Orders Placed This Encounter   ??? gabapentin (NEURONTIN) 100 mg capsule           Follow-up and Dispositions    ?? Return in about 6 weeks (around 04/06/2018).           Electronically Signed by:    Cedric Fishman, MD

## 2018-02-24 ENCOUNTER — Inpatient Hospital Stay: Payer: BLUE CROSS/BLUE SHIELD | Attending: Family | Primary: Internal Medicine

## 2018-02-25 MED ORDER — PROMETHAZINE 25 MG TAB
25 mg | ORAL_TABLET | Freq: Four times a day (QID) | ORAL | 2 refills | Status: DC | PRN
Start: 2018-02-25 — End: 2018-04-06

## 2018-02-25 NOTE — Telephone Encounter (Signed)
Rachael Brown saw you on 02/23/18 and would like a refill of phenergan. She uses [facility].

## 2018-04-06 ENCOUNTER — Ambulatory Visit: Primary: Internal Medicine

## 2018-04-06 ENCOUNTER — Ambulatory Visit: Admit: 2018-04-06 | Discharge: 2018-04-06 | Payer: PRIVATE HEALTH INSURANCE | Primary: Internal Medicine

## 2018-04-06 DIAGNOSIS — M797 Fibromyalgia: Secondary | ICD-10-CM

## 2018-04-06 MED ORDER — CELECOXIB 200 MG CAP
200 mg | ORAL_CAPSULE | Freq: Two times a day (BID) | ORAL | 3 refills | Status: DC
Start: 2018-04-06 — End: 2018-06-07

## 2018-04-06 MED ORDER — PROMETHAZINE 25 MG TAB
25 mg | ORAL_TABLET | Freq: Four times a day (QID) | ORAL | 2 refills | Status: AC | PRN
Start: 2018-04-06 — End: ?

## 2018-04-06 NOTE — Progress Notes (Signed)
[facility]  Dr. Elta Guadeloupe, MD  [address], [place] [address]  Phone:  [phone]  Fax:  [phone]    Name:  Rachael Brown  DOB:  January 27, 1961  Age:  57 y.o.    Encounter Date:  04/06/2018    PCP:  Darron Doom, MD    Chief Complaint   Patient presents with   ??? Follow-up   ??? Fibromyalgia   ??? Joint Pain     Right Hip, Bilateral Knees, Neck/Shoulders, Hands, Feet and across Low Back       HPI:  Rachael Brown is a 57 y.o. female who presents today for evaluation of arthralgias affecting mainly the hands and feet, it seems to be a combination of osteoarthritis, carpal tunnel syndrome and fibromyalgia.  Previous evaluation for rheumatoid arthritis and lupus were negative with normal ESR and CRP.  Recommended to continue with meloxicam 15 mg daily.  ??  Right lateral hand osteoarthritis, has been gentle with her hands   ??  Bilateral carpal tunnel syndrome, status post surgery, symptomatic, recommended to wear the cock-up splints bilaterally.  ??  Neck pain, she finished PT and pain persisted.   ??  Lower back pain, there are no signs of cauda equina or radiculopathy.  Recommended to continue with the above medications.  ??  Fibromyalgia, symptomatic, currently on gabapentin 100 mg in a.m., 100 mg p.m. and 200 mg nightly, tizanidine 4 mg, '4mg'$  pm and 14 mg qhs, baclofen 20 mg qhs and Meloxicam '15mg'$  every day     she cannot tolerate SSRI such as Cymbalta or Savella as well as she cannot tolerate Lyrica.      ??  Symptoms of small fiber neuropathy, she may have a nerve conduction study hopefully gabapentin will provide some relief of the numbness, tingling and burning sensation.    -RF/CCP  -ANA      Allergies   Allergen Reactions   ??? Codeine Unknown (comments)   ??? Duloxetine Other (comments)     Side effects   ??? Percocet [Oxycodone-Acetaminophen] Unknown (comments)   ??? Pregabalin Other (comments)     Side effects from the medication   ??? Savella [Milnacipran] Other (comments)     Past Medical  History:   Diagnosis Date   ??? Bilateral carpal tunnel syndrome 08/06/2014   ??? Carpal tunnel syndrome    ??? DDD (degenerative disc disease)    ??? Fibromyalgia    ??? Fibromyalgia 10/04/2014   ??? Intervertebral disc disease 08/06/2014   ??? Menopause    ??? Migraine    ??? Neuropathy    ??? OA (osteoarthritis)    ??? Osteoarthritis      Past Surgical History:   Procedure Laterality Date   ??? HX BACK SURGERY  2012   ??? HX CARPAL TUNNEL RELEASE Right    ??? HX DILATION AND CURETTAGE      & hysteroscopy due to uterine polyps   ??? HX HEENT      bilat eye   ??? HX ORTHOPAEDIC      CTR, Rotator cuff repair   ??? PR ANESTH,SURGERY OF SHOULDER Right     Due to osteoarthritis & rotator cuff tendonopathy.     Social History     Socioeconomic History   ??? Marital status: MARRIED     Spouse name: Not on file   ??? Number of children: Not on file   ??? Years of education: Not on file   ??? Highest education level:  Not on file   Tobacco Use   ??? Smoking status: Never Smoker   ??? Smokeless tobacco: Never Used   Substance and Sexual Activity   ??? Alcohol use: Yes     Alcohol/week: 0.0 oz     Comment: social   ??? Drug use: No   ??? Sexual activity: Yes     Partners: Male     Birth control/protection: None     Family History   Problem Relation Age of Onset   ??? Arthritis-osteo Mother    ??? Heart Disease Mother    ??? Diabetes Mother    ??? Arthritis-rheumatoid Mother    ??? Hypertension Mother    ??? SLE Mother    ??? Coronary Artery Disease Mother    ??? Cancer Father    ??? Diabetes Father    ??? Hypertension Father    ??? Hypertension Sister    ??? Heart Disease Brother    ??? Hypertension Brother    ??? Diabetes Brother    ??? Coronary Artery Disease Brother    ??? Heart Disease Brother    ??? Hypertension Brother    ??? Diabetes Brother    ??? Coronary Artery Disease Brother        Current Outpatient Medications   Medication Sig Dispense Refill   ??? omeprazole (PRILOSEC) 40 mg capsule      ??? fish oil-omega-3 fatty acids 340-1,000 mg capsule Take 1 Cap by mouth daily.     ??? promethazine (PHENERGAN) 25 mg  tablet Take 1 Tab by mouth every six (6) hours as needed for Nausea. 30 Tab 2   ??? celecoxib (CELEBREX) 200 mg capsule Take 1 Cap by mouth two (2) times a day for 90 days. 60 Cap 3   ??? cetirizine (ZYRTEC) 10 mg tablet      ??? gabapentin (NEURONTIN) 100 mg capsule Take 2 Caps by mouth three (3) times daily. Max Daily Amount: 600 mg. 180 Cap 3   ??? meloxicam (MOBIC) 15 mg tablet TAKE 1 TABLET BY MOUTH EVERY DAY  3   ??? tiZANidine (ZANAFLEX) 4 mg tablet Take 4 mg by mouth. Indications: 1 Tablet every 8 hours PRN and 4 tablets @ Bedtime     ??? melatonin tab tablet Take 10 mg by mouth nightly.     ??? baclofen (LIORESAL) 10 mg tablet 1-2 tabs at bedtime 180 Tab 1   ??? diclofenac (VOLTAREN) 1 % gel Apply 4 g to affected area four (4) times daily. Dispense 5 tubes,100 gram each,apply 4 grams QID 1500 g 1   ??? lidocaine (LIDODERM) 5 % Apply patch to the affected area for 12 hours a day and remove for 12 hours a day. 3 Package 2   ??? aspirin 81 mg tablet Take 81 mg by mouth.         ROS:  Review of Systems   Constitutional: Positive for malaise/fatigue. Negative for chills, diaphoresis, fever and weight loss.   HENT: Negative.  Negative for congestion, ear discharge, ear pain, hearing loss, nosebleeds, sinus pain, sore throat and tinnitus.    Eyes: Negative.  Negative for blurred vision, double vision, photophobia, pain, discharge and redness.   Respiratory: Negative.  Negative for cough, hemoptysis, sputum production, shortness of breath, wheezing and stridor.    Cardiovascular: Negative.  Negative for chest pain, palpitations, orthopnea, claudication, leg swelling and PND.   Gastrointestinal: Negative.  Negative for abdominal pain, blood in stool, constipation, diarrhea, heartburn, melena, nausea and vomiting.   Genitourinary: Negative.  Negative for dysuria, flank pain, frequency, hematuria and urgency.   Musculoskeletal: Positive for back pain, joint pain and neck pain.   Skin: Negative.  Negative for itching and rash.    Neurological: Negative for dizziness, tingling, tremors, sensory change, speech change, focal weakness, seizures, loss of consciousness, weakness and headaches.   Endo/Heme/Allergies: Negative.  Negative for environmental allergies. Does not bruise/bleed easily.   Psychiatric/Behavioral: Negative.  Negative for depression, hallucinations, memory loss, substance abuse and suicidal ideas. The patient is not nervous/anxious and does not have insomnia.        Visit Vitals  BP 122/84   Pulse 66   Temp 98.3 ??F (36.8 ??C) (Oral)   Resp 16   Ht _0  (1.575 m)   Wt 164 lb (74.4 kg)   SpO2 97%   BMI 30.00 kg/m??     Physical Exam:  Physical Exam   Constitutional: She is oriented to person, place, and time. She appears well-developed and well-nourished.   HENT:   Head: Normocephalic and atraumatic.   Right Ear: External ear normal.   Left Ear: External ear normal.   Mouth/Throat: Oropharynx is clear and moist.   Eyes: Pupils are equal, round, and reactive to light. Conjunctivae and EOM are normal.   Neck: Normal range of motion. Neck supple. No JVD present. No tracheal deviation present. No thyromegaly present.   Cardiovascular: Normal rate, normal heart sounds and intact distal pulses.   Abdominal: Soft. Bowel sounds are normal. She exhibits no distension and no mass. There is no tenderness. There is no rebound and no guarding.   Musculoskeletal: Normal range of motion. She exhibits no edema or tenderness.   Lateral Heberden's and Bouchard nodules, pain to palpation on the MCP, PIP as well as MTP joints but there is no synovitis.  There is bilateral Tinel and Phalen maneuver.  Negative Patrick and Gaenslen maneuver.   Lymphadenopathy:     She has no cervical adenopathy.   Neurological: She is alert and oriented to person, place, and time. She has normal reflexes.   Negative straight leg raise test, normal patellar reflexes and normal evaluation of the motor and sensitive branch of L5-S1.   Skin: Skin is warm.   Psychiatric:  She has a normal mood and affect. Her behavior is normal. Judgment and thought content normal.   Nursing note and vitals reviewed.      Review of Additional Data:  Office notes, Nursing Notes, Vital Signs, Radiology Reports, Lab Reports, Xray Reports, Historical Medical Records    Assessment/Plan:     Fibromyalgia, symptomatic, I am going to discontinue the meloxicam and start Celebrex up to 200 mg twice a day.  Recommended to continue with gabapentin 100 mg in a.m., 100 mg p.m. and 200 mg nightly, tizanidine 4 mg, 53m pm and 14 mg qhs, baclofen 20 mg qhs and stop the meloxicam.  We spoke about CBD oil.  Unfortunately, patient cannot tolerate Cymbalta (SSRI) and amitriptyline.    Small fiber neuropathy symptoms, recommended with gabapentin.    Bilateral carpal tunnel syndrome, minimally symptomatic.    Multiple site arthralgias, the work-up for an inflammatory arthropathy came back negative.  Normal ESR and CRP.  There is bilateral hand osteoarthritis, patient has been gentle as possible with her hands and we spoke about the paraffin box.    Neck/lower back pain, there are no signs of cauda equina radiculopathy, going to discontinue the meloxicam and start Celebrex 200 mg twice a day, continue with gabapentin 100 mg  in a.m. and p.m. 200 mg nightly.  Recommended to continue with tizanidine 4 mg in a.m./p.m. and 60 mg nightly as well as baclofen 20 mg nightly.  We spoke about CBD oil.        ICD-10-CM ICD-9-CM    1. Fibromyalgia M79.7 729.1 celecoxib (CELEBREX) 200 mg capsule   2. Bilateral carpal tunnel syndrome G56.03 354.0    3. Neck pain M54.2 723.1    4. Small fiber neuropathy G62.9 356.9    5. Chronic midline low back pain without sciatica M54.5 724.2 celecoxib (CELEBREX) 200 mg capsule    G89.29 338.29    6. Nausea R11.0 787.02 promethazine (PHENERGAN) 25 mg tablet     Orders Placed This Encounter   ??? promethazine (PHENERGAN) 25 mg tablet   ??? celecoxib (CELEBREX) 200 mg capsule           Follow-up and  Dispositions    ?? Return in about 2 months (around 06/07/2018) for Follow Up with Drue Second.           Electronically Signed by:    Cedric Fishman, MD

## 2018-04-06 NOTE — Progress Notes (Signed)
[facility]  Dr. Elta Guadeloupe, MD  [address], [place] [address]  Phone:  [phone]  Fax:  [phone]    Name:  Rachael Brown  DOB:  10/03/1961  Age:  57 y.o.    Encounter Date:  04/06/2018    PCP:  Darron Doom, MD    Chief Complaint   Patient presents with   ??? Follow-up   ??? Fibromyalgia   ??? Joint Pain     Right Hip, Bilateral Knees, Neck/Shoulders, Hands, Feet and across Low Back       HPI:  Rachael Brown is a 57 y.o. female who presents today for evaluation of arthralgias affecting mainly the hands and feet, it seems to be a combination of osteoarthritis, carpal tunnel syndrome and fibromyalgia.  Previous evaluation for rheumatoid arthritis and lupus were negative with normal ESR and CRP.  Recommended to continue with meloxicam 15 mg daily.  ??  Right lateral hand osteoarthritis, has been gentle with her hands   ??  Bilateral carpal tunnel syndrome, status post surgery, symptomatic, recommended to wear the cock-up splints bilaterally.  ??  Neck pain, she finished PT and pain persisted.   ??  Lower back pain, there are no signs of cauda equina or radiculopathy.  Recommended to continue with the above medications.  ??  Fibromyalgia, symptomatic, currently on gabapentin 100 mg in a.m., 100 mg p.m. and 200 mg nightly, tizanidine 4 mg, 12m pm and 14 mg qhs, baclofen 20 mg qhs and Meloxicam 122mevery day     she cannot tolerate SSRI such as Cymbalta or Savella as well as she cannot tolerate Lyrica.      ??  Symptoms of small fiber neuropathy, she may have a nerve conduction study hopefully gabapentin will provide some relief of the numbness, tingling and burning sensation.    -RF/CCP  -ANA      Allergies   Allergen Reactions   ??? Codeine Unknown (comments)   ??? Duloxetine Other (comments)     Side effects   ??? Percocet [Oxycodone-Acetaminophen] Unknown (comments)   ??? Pregabalin Other (comments)     Side effects from the medication   ??? Savella [Milnacipran] Other (comments)      Past Medical History:   Diagnosis Date   ??? Bilateral carpal tunnel syndrome 08/06/2014   ??? Carpal tunnel syndrome    ??? DDD (degenerative disc disease)    ??? Fibromyalgia    ??? Fibromyalgia 10/04/2014   ??? Intervertebral disc disease 08/06/2014   ??? Menopause    ??? Migraine    ??? Neuropathy    ??? OA (osteoarthritis)    ??? Osteoarthritis      Past Surgical History:   Procedure Laterality Date   ??? HX BACK SURGERY  2012   ??? HX CARPAL TUNNEL RELEASE Right    ??? HX DILATION AND CURETTAGE      & hysteroscopy due to uterine polyps   ??? HX HEENT      bilat eye   ??? HX ORTHOPAEDIC      CTR, Rotator cuff repair   ??? PR ANESTH,SURGERY OF SHOULDER Right     Due to osteoarthritis & rotator cuff tendonopathy.     Social History     Socioeconomic History   ??? Marital status: MARRIED     Spouse name: Not on file   ??? Number of children: Not on file   ??? Years of education: Not on file   ??? Highest education level:  Not on file   Tobacco Use   ??? Smoking status: Never Smoker   ??? Smokeless tobacco: Never Used   Substance and Sexual Activity   ??? Alcohol use: Yes     Alcohol/week: 0.0 oz     Comment: social   ??? Drug use: No   ??? Sexual activity: Yes     Partners: Male     Birth control/protection: None     Family History   Problem Relation Age of Onset   ??? Arthritis-osteo Mother    ??? Heart Disease Mother    ??? Diabetes Mother    ??? Arthritis-rheumatoid Mother    ??? Hypertension Mother    ??? SLE Mother    ??? Coronary Artery Disease Mother    ??? Cancer Father    ??? Diabetes Father    ??? Hypertension Father    ??? Hypertension Sister    ??? Heart Disease Brother    ??? Hypertension Brother    ??? Diabetes Brother    ??? Coronary Artery Disease Brother    ??? Heart Disease Brother    ??? Hypertension Brother    ??? Diabetes Brother    ??? Coronary Artery Disease Brother        Current Outpatient Medications   Medication Sig Dispense Refill   ??? omeprazole (PRILOSEC) 40 mg capsule      ??? fish oil-omega-3 fatty acids 340-1,000 mg capsule Take 1 Cap by mouth daily.      ??? promethazine (PHENERGAN) 25 mg tablet Take 1 Tab by mouth every six (6) hours as needed for Nausea. 30 Tab 2   ??? celecoxib (CELEBREX) 200 mg capsule Take 1 Cap by mouth two (2) times a day for 90 days. 60 Cap 3   ??? cetirizine (ZYRTEC) 10 mg tablet      ??? gabapentin (NEURONTIN) 100 mg capsule Take 2 Caps by mouth three (3) times daily. Max Daily Amount: 600 mg. 180 Cap 3   ??? meloxicam (MOBIC) 15 mg tablet TAKE 1 TABLET BY MOUTH EVERY DAY  3   ??? tiZANidine (ZANAFLEX) 4 mg tablet Take 4 mg by mouth. Indications: 1 Tablet every 8 hours PRN and 4 tablets @ Bedtime     ??? melatonin tab tablet Take 10 mg by mouth nightly.     ??? baclofen (LIORESAL) 10 mg tablet 1-2 tabs at bedtime 180 Tab 1   ??? diclofenac (VOLTAREN) 1 % gel Apply 4 g to affected area four (4) times daily. Dispense 5 tubes,100 gram each,apply 4 grams QID 1500 g 1   ??? lidocaine (LIDODERM) 5 % Apply patch to the affected area for 12 hours a day and remove for 12 hours a day. 3 Package 2   ??? aspirin 81 mg tablet Take 81 mg by mouth.         ROS:  Review of Systems   Constitutional: Positive for malaise/fatigue. Negative for chills, diaphoresis, fever and weight loss.   HENT: Negative.  Negative for congestion, ear discharge, ear pain, hearing loss, nosebleeds, sinus pain, sore throat and tinnitus.    Eyes: Negative.  Negative for blurred vision, double vision, photophobia, pain, discharge and redness.   Respiratory: Negative.  Negative for cough, hemoptysis, sputum production, shortness of breath, wheezing and stridor.    Cardiovascular: Negative.  Negative for chest pain, palpitations, orthopnea, claudication, leg swelling and PND.   Gastrointestinal: Negative.  Negative for abdominal pain, blood in stool, constipation, diarrhea, heartburn, melena, nausea and vomiting.   Genitourinary: Negative.  Negative for dysuria, flank pain, frequency, hematuria and urgency.   Musculoskeletal: Positive for back pain, joint pain and neck pain.    Skin: Negative.  Negative for itching and rash.   Neurological: Negative for dizziness, tingling, tremors, sensory change, speech change, focal weakness, seizures, loss of consciousness, weakness and headaches.   Endo/Heme/Allergies: Negative.  Negative for environmental allergies. Does not bruise/bleed easily.   Psychiatric/Behavioral: Negative.  Negative for depression, hallucinations, memory loss, substance abuse and suicidal ideas. The patient is not nervous/anxious and does not have insomnia.        Visit Vitals  BP 122/84   Pulse 66   Temp 98.3 ??F (36.8 ??C) (Oral)   Resp 16   Ht '5\' 2"'  (1.575 m)   Wt 164 lb (74.4 kg)   SpO2 97%   BMI 30.00 kg/m??     Physical Exam:  Physical Exam   Constitutional: She is oriented to person, place, and time. She appears well-developed and well-nourished.   HENT:   Head: Normocephalic and atraumatic.   Right Ear: External ear normal.   Left Ear: External ear normal.   Mouth/Throat: Oropharynx is clear and moist.   Eyes: Pupils are equal, round, and reactive to light. Conjunctivae and EOM are normal.   Neck: Normal range of motion. Neck supple. No JVD present. No tracheal deviation present. No thyromegaly present.   Cardiovascular: Normal rate, normal heart sounds and intact distal pulses.   Abdominal: Soft. Bowel sounds are normal. She exhibits no distension and no mass. There is no tenderness. There is no rebound and no guarding.   Musculoskeletal: Normal range of motion. She exhibits no edema or tenderness.   Lateral Heberden's and Bouchard nodules, pain to palpation on the MCP, PIP as well as MTP joints but there is no synovitis.  There is bilateral Tinel and Phalen maneuver.  Negative Patrick and Gaenslen maneuver.   Lymphadenopathy:     She has no cervical adenopathy.   Neurological: She is alert and oriented to person, place, and time. She has normal reflexes.   Negative straight leg raise test, normal patellar reflexes and normal  evaluation of the motor and sensitive branch of L5-S1.   Skin: Skin is warm.   Psychiatric: She has a normal mood and affect. Her behavior is normal. Judgment and thought content normal.   Nursing note and vitals reviewed.      Review of Additional Data:  Office notes, Nursing Notes, Vital Signs, Radiology Reports, Lab Reports, Xray Reports, Historical Medical Records    Assessment/Plan:     Fibromyalgia, symptomatic, I am going to discontinue the meloxicam and start Celebrex up to 200 mg twice a day.  Recommended to continue with gabapentin 100 mg in a.m., 100 mg p.m. and 200 mg nightly, tizanidine 4 mg, 63m pm and 14 mg qhs, baclofen 20 mg qhs and stop the meloxicam.  We spoke about CBD oil.  Unfortunately, patient cannot tolerate Cymbalta (SSRI) and amitriptyline.    Small fiber neuropathy symptoms, recommended with gabapentin.    Bilateral carpal tunnel syndrome, minimally symptomatic.    Multiple site arthralgias, the work-up for an inflammatory arthropathy came back negative.  Normal ESR and CRP.  There is bilateral hand osteoarthritis, patient has been gentle as possible with her hands and we spoke about the paraffin box.    Neck/lower back pain, there are no signs of cauda equina radiculopathy, going to discontinue the meloxicam and start Celebrex 200 mg twice a day, continue with gabapentin 100 mg  in a.m. and p.m. 200 mg nightly.  Recommended to continue with tizanidine 4 mg in a.m./p.m. and 60 mg nightly as well as baclofen 20 mg nightly.  We spoke about CBD oil.        ICD-10-CM ICD-9-CM    1. Fibromyalgia M79.7 729.1 celecoxib (CELEBREX) 200 mg capsule   2. Bilateral carpal tunnel syndrome G56.03 354.0    3. Neck pain M54.2 723.1    4. Small fiber neuropathy G62.9 356.9    5. Chronic midline low back pain without sciatica M54.5 724.2 celecoxib (CELEBREX) 200 mg capsule    G89.29 338.29    6. Nausea R11.0 787.02 promethazine (PHENERGAN) 25 mg tablet     Orders Placed This Encounter    ??? promethazine (PHENERGAN) 25 mg tablet   ??? celecoxib (CELEBREX) 200 mg capsule           Follow-up and Dispositions    ?? Return in about 2 months (around 06/07/2018) for Follow Up with Drue Second.           Electronically Signed by:    Cedric Fishman, MD

## 2018-04-26 ENCOUNTER — Encounter

## 2018-05-02 ENCOUNTER — Inpatient Hospital Stay: Admit: 2018-05-02 | Payer: BLUE CROSS/BLUE SHIELD | Attending: Internal Medicine | Primary: Internal Medicine

## 2018-05-02 DIAGNOSIS — M810 Age-related osteoporosis without current pathological fracture: Secondary | ICD-10-CM

## 2018-05-11 ENCOUNTER — Encounter

## 2018-05-11 MED ORDER — GABAPENTIN 100 MG CAP
100 mg | ORAL_CAPSULE | Freq: Three times a day (TID) | ORAL | 3 refills | Status: DC
Start: 2018-05-11 — End: 2018-06-02

## 2018-05-11 NOTE — Telephone Encounter (Signed)
After obtaining an [employer] report for gabapentin 100 mg,[facility] [place] and spoke to DonnellsonShannon, Teacher, early years/preharmacist. Dispense 2 caps TID,#180,+3 refills.

## 2018-05-11 NOTE — Telephone Encounter (Signed)
Rachael Brown called in today asking for a refill of gabapentin 100 mg TID. She uses [facility].

## 2018-05-17 NOTE — Telephone Encounter (Signed)
Rachael Brown called in today asking for her refill of [facility] to [facility] changed [facility] to [facility]. She just picked up today at [facility]. I will change the next refill when she uses what she has for a one month supply.She voiced understanding.

## 2018-06-02 ENCOUNTER — Telehealth

## 2018-06-02 MED ORDER — GABAPENTIN 100 MG CAP
100 mg | ORAL_CAPSULE | Freq: Three times a day (TID) | ORAL | 0 refills | Status: AC
Start: 2018-06-02 — End: ?

## 2018-06-02 NOTE — Telephone Encounter (Signed)
Per Dr.Estrada check with the Pt and see how she is taking her Gabapentin. She states that she is taking Gabapentin 100 mg TID. She wasn't able to tolerate that 2nd Pill @ Bedtime. Pt also states that [facility] and that is probaly why [facility] was [facility] at [facility] where [facility] can only fill [facility].    Per Dr.Estrada OK to correct and [facility] to [facility]entin 100 mg TID.#[phone] with 0 RF.   [facility]

## 2018-06-07 ENCOUNTER — Ambulatory Visit: Attending: Family | Primary: Internal Medicine

## 2018-06-07 ENCOUNTER — Ambulatory Visit
Admit: 2018-06-07 | Discharge: 2018-06-07 | Payer: PRIVATE HEALTH INSURANCE | Attending: Family | Primary: Internal Medicine

## 2018-06-07 DIAGNOSIS — G629 Polyneuropathy, unspecified: Secondary | ICD-10-CM

## 2018-06-07 NOTE — Progress Notes (Signed)
[facility]  [address], [place] [address]  Phone:  [phone]  Fax:  [phone]    Name:  Rachael Brown  DOB:  October 10, 1960  Age:  57 y.o.    Encounter Date:  06/07/2018    PCP:  Rachael Doom, MD      HPI:  Rachael Brown is a 57 y.o. female who presents today for follow up of arthralgias affecting mainly the shoulders, knees,  hands, neck and feet, it seems to be a combination of osteoarthritis, carpal tunnel syndrome and fibromyalgia.  Previous evaluation for rheumatoid arthritis and lupus were negative with normal ESR and CRP.  ??  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  Bilateral carpal tunnel syndrome, status post surgery, symptomatic, recommended to wear the cock-up splints bilaterally.  ??  Neck pain, she finished PT and pain persisted. The therapy does help for a while but has large copays  History of neck surgery in 2012 due to arthritis  Has neuropathy  Complains of poor balance which is not new  Sleeps poorly, 3-4 hours/night, slight improvement with the gabapentin    ??  Lower back pain, there are no signs of cauda equina or radiculopathy.    ??  Fibromyalgia, symptomatic, currently on gabapentin 100 mg in a.m., 100 mg p.m. and 100 mg nightly (had tried 262m at HS but could not tolerate), tizanidine 4 mg, 448mpm and 14 mg qhs, baclofen 20 mg qhs and celebrex, tried but could not afford copay, is going to resume mobic   Has tried diclofenac in the past    she cannot tolerate SSRI such as Cymbalta or Savella as well as she cannot tolerate Lyrica.      ??  Symptoms of small fiber neuropathy, she may have a nerve conduction study hopefully gabapentin will provide some relief of the numbness, tingling and burning sensation.    -RF/CCP  -ANA    She is disabled though she has been  turned down for benefits twice  [place] a [place]  Is working on weight loss, so far down 40 pounds which helps with some of the joint pain    Allergies   Allergen Reactions   ??? Codeine Unknown (comments)   ??? Duloxetine Other (comments)     Side effects   ??? Percocet [Oxycodone-Acetaminophen] Unknown (comments)   ??? Pregabalin Other (comments)     Side effects from the medication   ??? Savella [Milnacipran] Other (comments)     Past Medical History:   Diagnosis Date   ??? Bilateral carpal tunnel syndrome 08/06/2014   ??? Carpal tunnel syndrome    ??? DDD (degenerative disc disease)    ??? Fibromyalgia    ??? Fibromyalgia 10/04/2014   ??? Intervertebral disc disease 08/06/2014   ??? Menopause    ??? Migraine    ??? Neuropathy    ??? OA (osteoarthritis)    ??? Osteoarthritis      Past Surgical History:   Procedure Laterality Date   ??? HX BACK SURGERY  2012   ??? HX CARPAL TUNNEL RELEASE Right    ??? HX DILATION AND CURETTAGE      & hysteroscopy due to uterine polyps   ???  HX HEENT      bilat eye   ??? HX ORTHOPAEDIC      CTR, Rotator cuff repair   ??? PR ANESTH,SURGERY OF SHOULDER Right     Due to osteoarthritis & rotator cuff tendonopathy.     Social History     Socioeconomic History   ??? Marital status: MARRIED     Spouse name: Not on file   ??? Number of children: Not on file   ??? Years of education: Not on file   ??? Highest education level: Not on file   Tobacco Use   ??? Smoking status: Never Smoker   ??? Smokeless tobacco: Never Used   Substance and Sexual Activity   ??? Alcohol use: Yes     Alcohol/week: 0.0 standard drinks     Comment: social   ??? Drug use: No   ??? Sexual activity: Yes     Partners: Male     Birth control/protection: None     Family History   Problem Relation Age of Onset   ??? Arthritis-osteo Mother    ??? Heart Disease Mother    ??? Diabetes Mother    ??? Arthritis-rheumatoid Mother    ??? Hypertension Mother    ??? SLE Mother    ??? Coronary Artery Disease Mother    ??? Cancer Father    ??? Diabetes  Father    ??? Hypertension Father    ??? Hypertension Sister    ??? Heart Disease Brother    ??? Hypertension Brother    ??? Diabetes Brother    ??? Coronary Artery Disease Brother    ??? Heart Disease Brother    ??? Hypertension Brother    ??? Diabetes Brother    ??? Coronary Artery Disease Brother        Current Outpatient Medications   Medication Sig Dispense Refill   ??? meloxicam (MOBIC) 15 mg tablet Take 1 Tab by mouth daily.     ??? gabapentin (NEURONTIN) 100 mg capsule Take 1 Cap by mouth three (3) times daily. Max Daily Amount: 300 mg. 270 Cap 0   ??? omeprazole (PRILOSEC) 40 mg capsule      ??? fish oil-omega-3 fatty acids 340-1,000 mg capsule Take 1 Cap by mouth daily.     ??? promethazine (PHENERGAN) 25 mg tablet Take 1 Tab by mouth every six (6) hours as needed for Nausea. 30 Tab 2   ??? cetirizine (ZYRTEC) 10 mg tablet      ??? tiZANidine (ZANAFLEX) 4 mg tablet Take 4 mg by mouth. Indications: 1 Tablet every 8 hours PRN and 4 tablets @ Bedtime     ??? melatonin tab tablet Take 10 mg by mouth nightly.     ??? baclofen (LIORESAL) 10 mg tablet 1-2 tabs at bedtime 180 Tab 1   ??? diclofenac (VOLTAREN) 1 % gel Apply 4 g to affected area four (4) times daily. Dispense 5 tubes,100 gram each,apply 4 grams QID 1500 g 1   ??? lidocaine (LIDODERM) 5 % Apply patch to the affected area for 12 hours a day and remove for 12 hours a day. 3 Package 2   ??? aspirin 81 mg tablet Take 81 mg by mouth.         ROS:  Constitutional: Positive for malaise/fatigue. Negative for chills, diaphoresis, fever and weight loss.   HENT: Negative.  Negative for congestion, ear discharge, ear pain, hearing loss, nosebleeds, sinus pain, sore throat and tinnitus.    Eyes: Negative.  Negative for blurred vision,  double vision, photophobia, pain, discharge and redness.   Respiratory: Negative.  Negative for cough, hemoptysis, sputum production, shortness of breath, wheezing and stridor.    Cardiovascular: positive for palpitations  Negative for chest pain, orthopnea, claudication, leg  swelling and PND.   Gastrointestinal: Negative.  Negative for abdominal pain, blood in stool, constipation, diarrhea, heartburn, melena, nausea and vomiting.   Genitourinary: Negative.  Negative for dysuria, flank pain, frequency, hematuria and urgency.   Musculoskeletal: Positive for back pain, joint pain and neck pain.   Skin: Negative.  Negative for itching and rash.   Neurological: Negative for dizziness, tingling, tremors, sensory change, speech change, focal weakness, seizures, loss of consciousness, weakness and headaches.   Endo/Heme/Allergies: Negative.  Negative for environmental allergies. Does not bruise/bleed easily.   Psychiatric/Behavioral: Negative.  Negative for depression, hallucinations, memory loss, substance abuse and suicidal ideas. The patient is not nervous/anxious but does  have insomnia.      Visit Vitals  BP 124/70 (BP 1 Location: Left arm, BP Patient Position: Sitting)   Pulse 86   Temp 98.2 ??F (36.8 ??C) (Oral)   Resp 18   Ht 5' 2"  (1.575 m)   Wt 163 lb (73.9 kg)   SpO2 97%   BMI 29.81 kg/m??         Physical Exam   Constitutional: She is oriented to person, place, and time. She appears well-developed and well-nourished.   HENT:   Head: Normocephalic and atraumatic.   Right Ear: External ear normal.   Left Ear: External ear normal.   Mouth/Throat: Oropharynx is clear and moist.   Eyes: Pupils are equal, round, and reactive to light. Conjunctivae and EOM are normal.   Neck: Normal range of motion. Neck supple. No JVD present. No tracheal deviation present. No thyromegaly present.   Cardiovascular: irregular, ntact distal pulses.   Abdominal: Soft. Bowel sounds are normal. She exhibits no distension and no mass. There is no tenderness. There is no rebound and no guarding.   Musculoskeletal: Normal range of motion. She exhibits no edema or tenderness.   Lateral Heberden's and Bouchard nodules, pain to palpation on the MCP, PIP as well as MTP joints but there is no synovitis.  There is  bilateral Tinel and Phalen maneuver.  Negative Patrick and Gaenslen maneuver.   Lymphadenopathy:     She has no cervical adenopathy.   Neurological: She is alert and oriented to person, place, and time. She has normal reflexes.   Negative straight leg raise test, normal patellar reflexes and normal evaluation of the motor and sensitive branch of L5-S1.   Skin: Skin is warm.   Psychiatric: She has a normal mood and affect. Her behavior is normal. Judgment and thought content normal.   Nursing note and vitals reviewed.    Review of Additional Data:  Office notes, Nursing Notes, Vital Signs, Radiology Reports, Lab Reports, Xray Reports, Historical Medical Records    Assessment/Plan:     Fibromyalgia, symptomatic, she is going to resume the mobic, helps as well as the celebrex but is less expensive  .  Recommended to continue with gabapentin 100 tid, tizanidine 4 mg, 68m pm and 14 mg qhs, baclofen 20 mg qhs.  We spoke about CBD oil.  Unfortunately, patient cannot tolerate Cymbalta (SSRI) and amitriptyline.    Small fiber neuropathy symptoms, continue with gabapentin.    Bilateral carpal tunnel syndrome    Multiple site arthralgias, the work-up for an inflammatory arthropathy came back negative.  Normal ESR and CRP.  There is bilateral hand osteoarthritis, patient has been gentle as possible with her hands and we spoke about the paraffin wax in the past    Neck/lower back pain, there are no signs of cauda equina radiculopathy, continue mobic, continue with gabapentin 100 tid.  Recommended to continue with tizanidine as well as baclofen 20 mg nightly.  We spoke about CBD oil.    She may try the gabapentin tid and at HS since she sleeps poorly but could not tolerate the 25m at HS  She uses melatonin  Uses voltaren gel  Occasionally uses lidoderm patches  She has had normal DEXA, recommend calcium and vitamin D    She is irregular today, occasional palpitations which is not new, to follow up with PCP later this week       ICD-10-CM ICD-9-CM    1. Small fiber neuropathy G62.9 356.9    2. Fibromyalgia M79.7 729.1    3. Bilateral carpal tunnel syndrome G56.03 354.0    4. Primary osteoarthritis involving multiple joints M15.0 715.09    5. Medication monitoring encounter Z51.81 V58.83              Follow-up and Dispositions    ?? Return in about 3 months (around 09/06/2018).           Electronically Signed by:    PNed Clines APRN

## 2018-06-07 NOTE — Progress Notes (Signed)
[facility]  [address], [place] [address]  Phone:  [phone]  Fax:  [phone]    Name:  Rachael Brown  DOB:  [birth year]  Age:  57 y.o.    Encounter Date:  06/07/2018    PCP:  Darron Doom, MD      HPI:  Ondine Gemme is a 57 y.o. female who presents today for follow up of arthralgias affecting mainly the shoulders, knees,  hands, neck and feet, it seems to be a combination of osteoarthritis, carpal tunnel syndrome and fibromyalgia.  Previous evaluation for rheumatoid arthritis and lupus were negative with normal ESR and CRP.  ??  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  Bilateral carpal tunnel syndrome, status post surgery, symptomatic, recommended to wear the cock-up splints bilaterally.  ??  Neck pain, she finished PT and pain persisted. The therapy does help for a while but has large copays  History of neck surgery in 2012 due to arthritis  Has neuropathy  Complains of poor balance which is not new  Sleeps poorly, 3-4 hours/night, slight improvement with the gabapentin    ??  Lower back pain, there are no signs of cauda equina or radiculopathy.    ??  Fibromyalgia, symptomatic, currently on gabapentin 100 mg in a.m., 100 mg p.m. and 100 mg nightly (had tried 246m at HS but could not tolerate), tizanidine 4 mg, 441mpm and 14 mg qhs, baclofen 20 mg qhs and celebrex, tried but could not afford copay, is going to resume mobic   Has tried diclofenac in the past    she cannot tolerate SSRI such as Cymbalta or Savella as well as she cannot tolerate Lyrica.      ??  Symptoms of small fiber neuropathy, she may have a nerve conduction study hopefully gabapentin will provide some relief of the numbness, tingling and burning sensation.    -RF/CCP  -ANA     She is disabled though she has been turned down for benefits twice  [place]  Is working on weight loss, so far down 40 pounds which helps with some of the joint pain    Allergies   Allergen Reactions   ??? Codeine Unknown (comments)   ??? Duloxetine Other (comments)     Side effects   ??? Percocet [Oxycodone-Acetaminophen] Unknown (comments)   ??? Pregabalin Other (comments)     Side effects from the medication   ??? Savella [Milnacipran] Other (comments)     Past Medical History:   Diagnosis Date   ??? Bilateral carpal tunnel syndrome 08/06/2014   ??? Carpal tunnel syndrome    ??? DDD (degenerative disc disease)    ??? Fibromyalgia    ??? Fibromyalgia 10/04/2014   ??? Intervertebral disc disease 08/06/2014   ??? Menopause    ??? Migraine    ??? Neuropathy    ??? OA (osteoarthritis)    ??? Osteoarthritis      Past Surgical History:   Procedure Laterality Date   ??? HX BACK SURGERY  2012   ??? HX CARPAL TUNNEL RELEASE Right    ??? HX DILATION AND CURETTAGE      & hysteroscopy due to uterine polyps   ???  HX HEENT      bilat eye   ??? HX ORTHOPAEDIC      CTR, Rotator cuff repair   ??? PR ANESTH,SURGERY OF SHOULDER Right     Due to osteoarthritis & rotator cuff tendonopathy.     Social History     Socioeconomic History   ??? Marital status: MARRIED     Spouse name: Not on file   ??? Number of children: Not on file   ??? Years of education: Not on file   ??? Highest education level: Not on file   Tobacco Use   ??? Smoking status: Never Smoker   ??? Smokeless tobacco: Never Used   Substance and Sexual Activity   ??? Alcohol use: Yes     Alcohol/week: 0.0 standard drinks     Comment: social   ??? Drug use: No   ??? Sexual activity: Yes     Partners: Male     Birth control/protection: None     Family History   Problem Relation Age of Onset   ??? Arthritis-osteo Mother    ??? Heart Disease Mother    ??? Diabetes Mother    ??? Arthritis-rheumatoid Mother    ??? Hypertension Mother    ??? SLE Mother     ??? Coronary Artery Disease Mother    ??? Cancer Father    ??? Diabetes Father    ??? Hypertension Father    ??? Hypertension Sister    ??? Heart Disease Brother    ??? Hypertension Brother    ??? Diabetes Brother    ??? Coronary Artery Disease Brother    ??? Heart Disease Brother    ??? Hypertension Brother    ??? Diabetes Brother    ??? Coronary Artery Disease Brother        Current Outpatient Medications   Medication Sig Dispense Refill   ??? meloxicam (MOBIC) 15 mg tablet Take 1 Tab by mouth daily.     ??? gabapentin (NEURONTIN) 100 mg capsule Take 1 Cap by mouth three (3) times daily. Max Daily Amount: 300 mg. 270 Cap 0   ??? omeprazole (PRILOSEC) 40 mg capsule      ??? fish oil-omega-3 fatty acids 340-1,000 mg capsule Take 1 Cap by mouth daily.     ??? promethazine (PHENERGAN) 25 mg tablet Take 1 Tab by mouth every six (6) hours as needed for Nausea. 30 Tab 2   ??? cetirizine (ZYRTEC) 10 mg tablet      ??? tiZANidine (ZANAFLEX) 4 mg tablet Take 4 mg by mouth. Indications: 1 Tablet every 8 hours PRN and 4 tablets @ Bedtime     ??? melatonin tab tablet Take 10 mg by mouth nightly.     ??? baclofen (LIORESAL) 10 mg tablet 1-2 tabs at bedtime 180 Tab 1   ??? diclofenac (VOLTAREN) 1 % gel Apply 4 g to affected area four (4) times daily. Dispense 5 tubes,100 gram each,apply 4 grams QID 1500 g 1   ??? lidocaine (LIDODERM) 5 % Apply patch to the affected area for 12 hours a day and remove for 12 hours a day. 3 Package 2   ??? aspirin 81 mg tablet Take 81 mg by mouth.         ROS:  Constitutional: Positive for malaise/fatigue. Negative for chills, diaphoresis, fever and weight loss.   HENT: Negative.  Negative for congestion, ear discharge, ear pain, hearing loss, nosebleeds, sinus pain, sore throat and tinnitus.    Eyes: Negative.  Negative for blurred vision,  double vision, photophobia, pain, discharge and redness.   Respiratory: Negative.  Negative for cough, hemoptysis, sputum production, shortness of breath, wheezing and stridor.     Cardiovascular: positive for palpitations  Negative for chest pain, orthopnea, claudication, leg swelling and PND.   Gastrointestinal: Negative.  Negative for abdominal pain, blood in stool, constipation, diarrhea, heartburn, melena, nausea and vomiting.   Genitourinary: Negative.  Negative for dysuria, flank pain, frequency, hematuria and urgency.   Musculoskeletal: Positive for back pain, joint pain and neck pain.   Skin: Negative.  Negative for itching and rash.   Neurological: Negative for dizziness, tingling, tremors, sensory change, speech change, focal weakness, seizures, loss of consciousness, weakness and headaches.   Endo/Heme/Allergies: Negative.  Negative for environmental allergies. Does not bruise/bleed easily.   Psychiatric/Behavioral: Negative.  Negative for depression, hallucinations, memory loss, substance abuse and suicidal ideas. The patient is not nervous/anxious but does  have insomnia.      Visit Vitals  BP 124/70 (BP 1 Location: Left arm, BP Patient Position: Sitting)   Pulse 86   Temp 98.2 ??F (36.8 ??C) (Oral)   Resp 18   Ht '5\' 2"'  (1.575 m)   Wt 163 lb (73.9 kg)   SpO2 97%   BMI 29.81 kg/m??         Physical Exam   Constitutional: She is oriented to person, place, and time. She appears well-developed and well-nourished.   HENT:   Head: Normocephalic and atraumatic.   Right Ear: External ear normal.   Left Ear: External ear normal.   Mouth/Throat: Oropharynx is clear and moist.   Eyes: Pupils are equal, round, and reactive to light. Conjunctivae and EOM are normal.   Neck: Normal range of motion. Neck supple. No JVD present. No tracheal deviation present. No thyromegaly present.   Cardiovascular: irregular, ntact distal pulses.   Abdominal: Soft. Bowel sounds are normal. She exhibits no distension and no mass. There is no tenderness. There is no rebound and no guarding.   Musculoskeletal: Normal range of motion. She exhibits no edema or tenderness.    Lateral Heberden's and Bouchard nodules, pain to palpation on the MCP, PIP as well as MTP joints but there is no synovitis.  There is bilateral Tinel and Phalen maneuver.  Negative Patrick and Gaenslen maneuver.   Lymphadenopathy:     She has no cervical adenopathy.   Neurological: She is alert and oriented to person, place, and time. She has normal reflexes.   Negative straight leg raise test, normal patellar reflexes and normal evaluation of the motor and sensitive branch of L5-S1.   Skin: Skin is warm.   Psychiatric: She has a normal mood and affect. Her behavior is normal. Judgment and thought content normal.   Nursing note and vitals reviewed.    Review of Additional Data:  Office notes, Nursing Notes, Vital Signs, Radiology Reports, Lab Reports, Xray Reports, Historical Medical Records    Assessment/Plan:     Fibromyalgia, symptomatic, she is going to resume the mobic, helps as well as the celebrex but is less expensive  .  Recommended to continue with gabapentin 100 tid, tizanidine 4 mg, 60m pm and 14 mg qhs, baclofen 20 mg qhs.  We spoke about CBD oil.  Unfortunately, patient cannot tolerate Cymbalta (SSRI) and amitriptyline.    Small fiber neuropathy symptoms, continue with gabapentin.    Bilateral carpal tunnel syndrome    Multiple site arthralgias, the work-up for an inflammatory arthropathy came back negative.  Normal ESR and CRP.  There is bilateral hand osteoarthritis, patient has been gentle as possible with her hands and we spoke about the paraffin wax in the past    Neck/lower back pain, there are no signs of cauda equina radiculopathy, continue mobic, continue with gabapentin 100 tid.  Recommended to continue with tizanidine as well as baclofen 20 mg nightly.  We spoke about CBD oil.    She may try the gabapentin tid and at HS since she sleeps poorly but could not tolerate the 274m at HS  She uses melatonin  Uses voltaren gel  Occasionally uses lidoderm patches   She has had normal DEXA, recommend calcium and vitamin D    She is irregular today, occasional palpitations which is not new, to follow up with PCP later this week      ICD-10-CM ICD-9-CM    1. Small fiber neuropathy G62.9 356.9    2. Fibromyalgia M79.7 729.1    3. Bilateral carpal tunnel syndrome G56.03 354.0    4. Primary osteoarthritis involving multiple joints M15.0 715.09    5. Medication monitoring encounter Z51.81 V58.83              Follow-up and Dispositions    ?? Return in about 3 months (around 09/06/2018).           Electronically Signed by:    PNed Clines APRN

## 2019-01-26 NOTE — Progress Notes (Signed)
Pt notified of need to postpone appt due to closing of [facility]. Pt advised to call in June to reschedule when this office affiliation with [facility] begins

## 2019-01-26 NOTE — Progress Notes (Signed)
Pt notified of need to postpone appt due to closing of [facility]. Pt advised to call in June to reschedule when this office affiliation with [facility] begins

## 2019-02-22 ENCOUNTER — Encounter: Attending: Family | Primary: Internal Medicine

## 2022-10-20 IMAGING — MR MRI LUMBAR SPINE WITHOUT CONTRAST
6 of 8 series · 12 of 48 positions shown · IV contrast (gadolinium)
Comparison: None.

________________________________________________________________________________________________ 
MRI LUMBAR SPINE WITHOUT CONTRAST, 10/20/2022 [DATE]: 
CLINICAL INDICATION: Low back pain with left sciatica.
TECHNIQUE: Multiplanar, multiecho position MR images of the lumbar spine were 
performed without intravenous gadolinium enhancement. Patient was scanned on a 
1.5T magnet.

[Series 801: survey · axial · 10.0mm · 1.25mm/px · 1 of 10 slices shown]
[im 1/10]
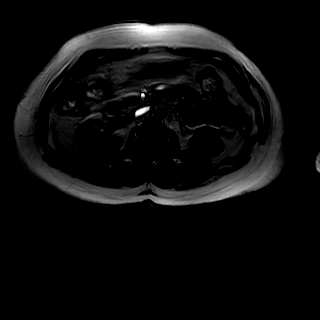

[Series 901: t2w_cor-surv · coronal · 6.0mm · 0.62mm/px · 1 of 13 slices shown]
[im 1/13]
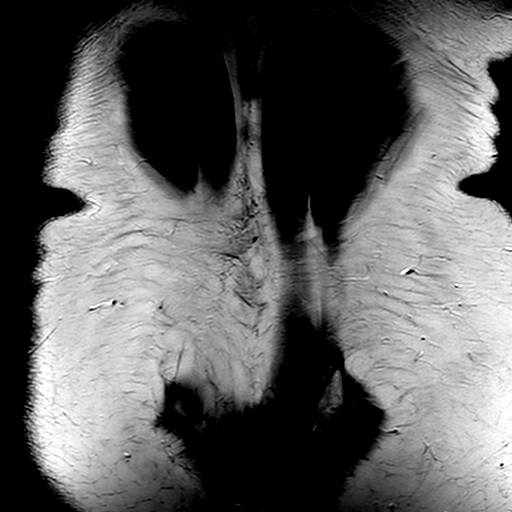

[Series 1001: t1_tse_sag · sagittal · 4.0mm · 0.46mm/px · 3 of 17 slices shown]
[im 1/17]
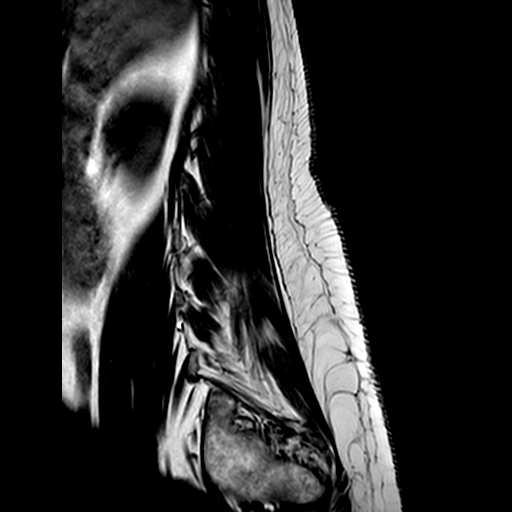
[im 9/17]
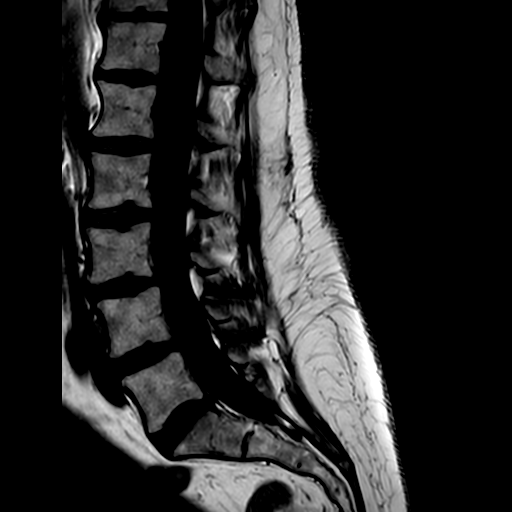
[im 17/17]
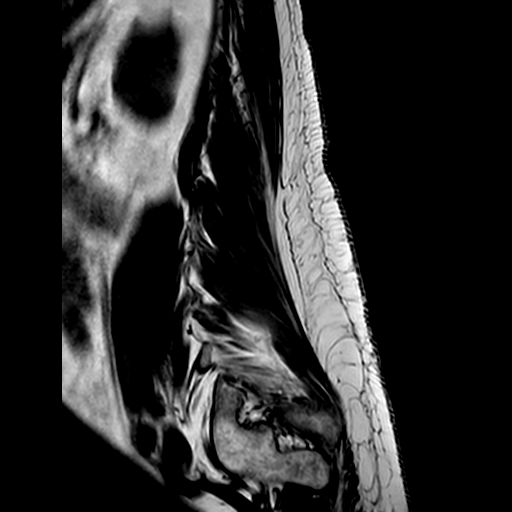

[Series 1102: (id)_mdixon_tse · sagittal · 4.0mm · 0.37mm/px · 3 of 17 slices shown]
[im 1/17]
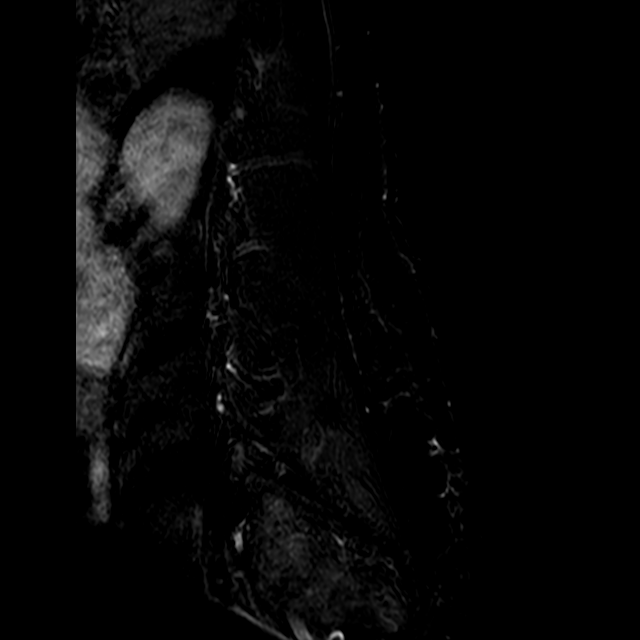
[im 9/17]
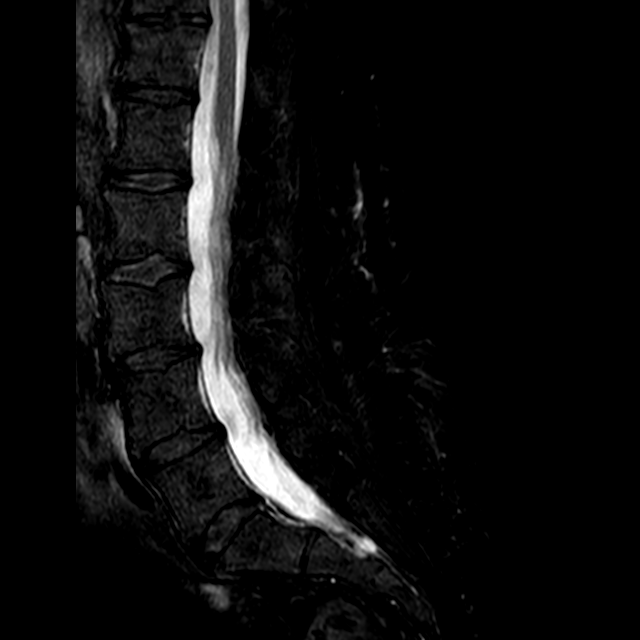
[im 17/17]
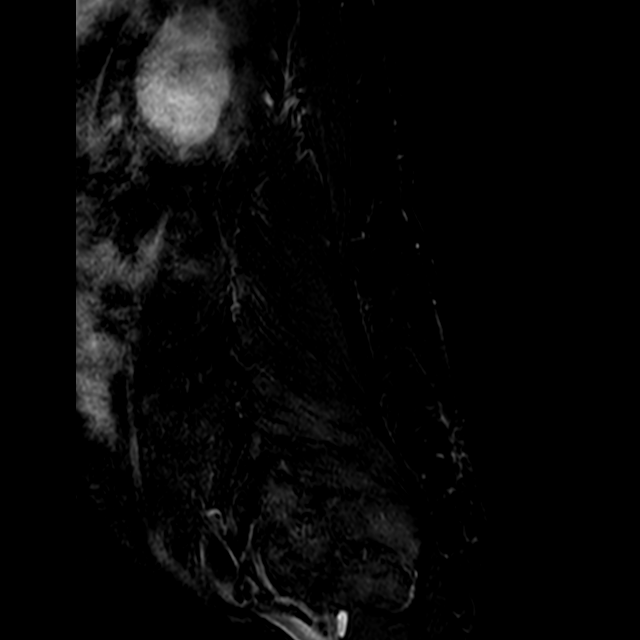

[Series 1103: st2w_mdixon_tse · sagittal · 4.0mm · 0.37mm/px · 3 of 17 slices shown]
[im 1/17]
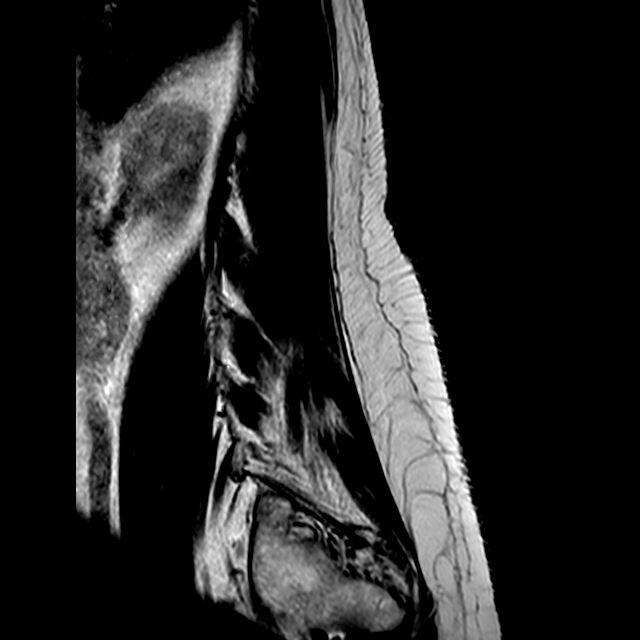
[im 9/17]
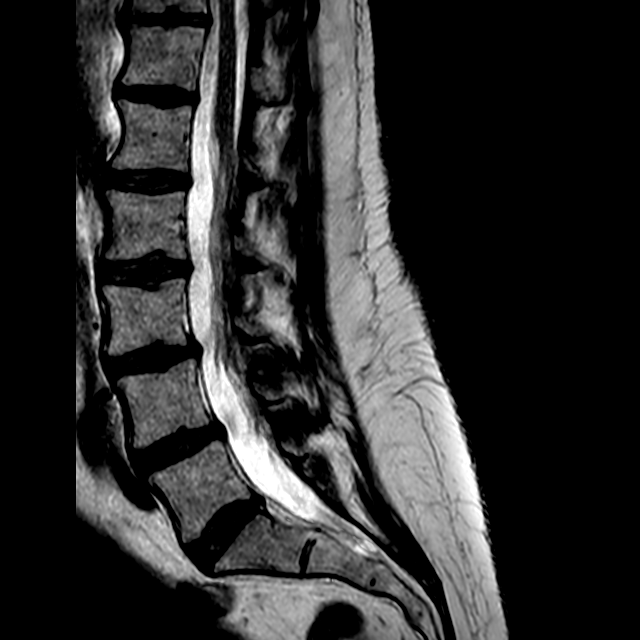
[im 17/17]
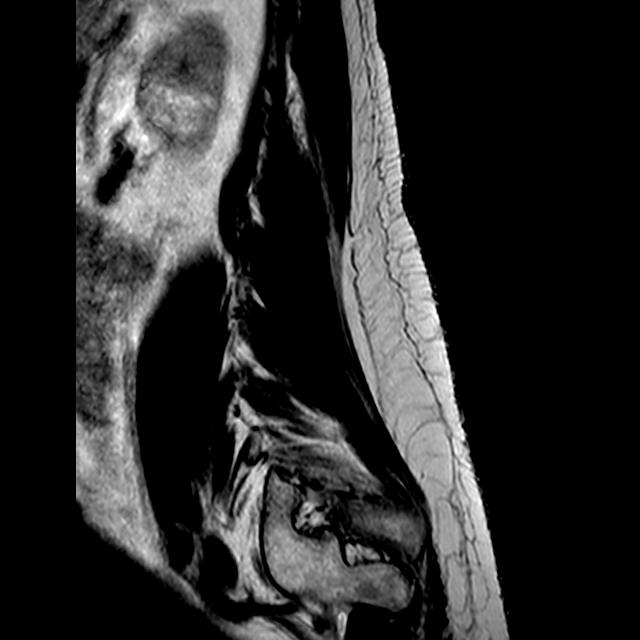

[Series 1202: (id) view_ax mpr · axial · 1.0mm · 0.25mm/px · 1 of 98 slices shown]
[im 1/98]
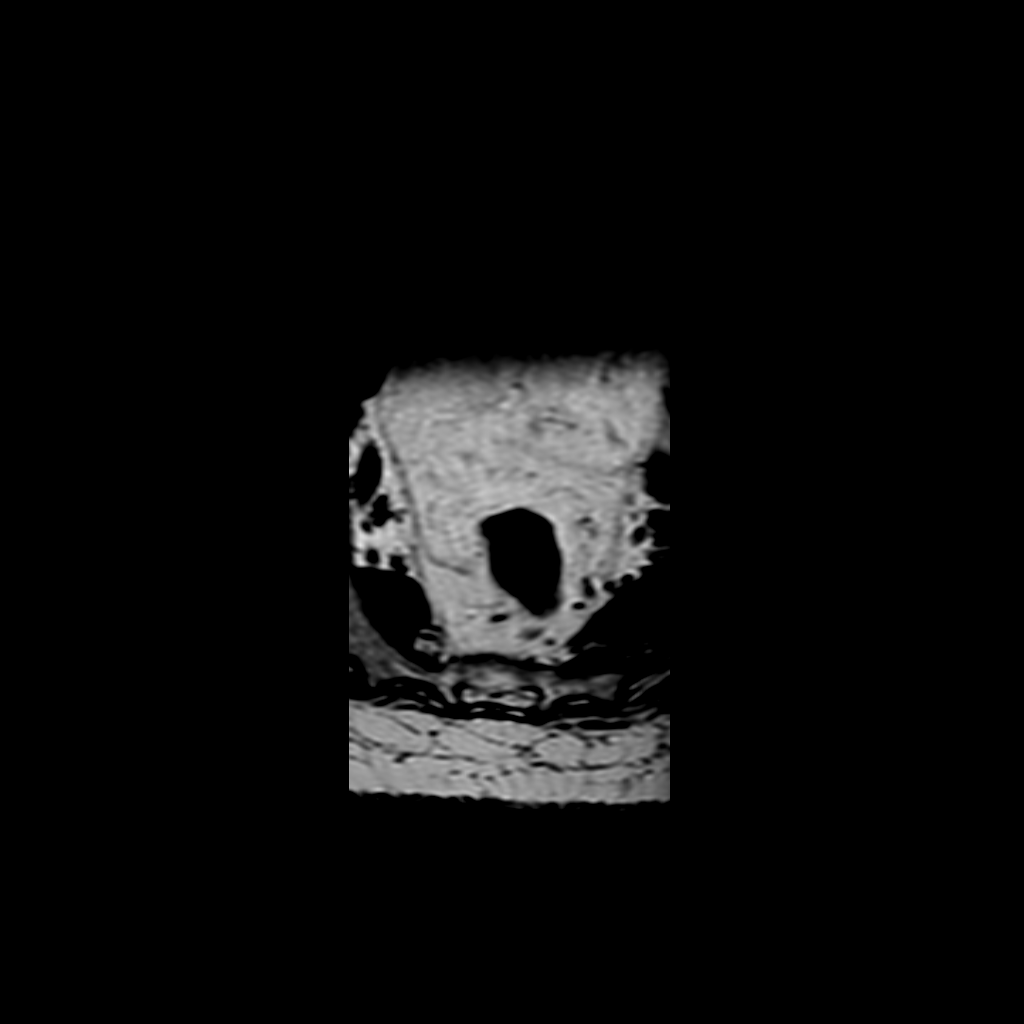

[12 of 48 positions shown; findings below may reference images not displayed]

FINDINGS: -------------------------------------------------------------------------------- 
------ 
GENERAL: 
Nomenclature is based on 5 lumbar type vertebral bodies.     
ALIGNMENT: Mild anterolisthesis of L3 on L4. 
VERTEBRAL BODY HEIGHT: Normal.  
MARROW SIGNAL: No focal suspect signal abnormality. 
CORD SIGNAL: Normal distal spinal cord and cauda equina.  Conus terminates at 
L1-L2. 
ADDITIONAL FINDINGS: None. 
Modic I-II: None. 
Ligamentum Flavum > 2.5 mm: All levels. 
-------------------------------------------------------------------------------- 
------ 
SEGMENTAL: 
T12-L1: Trace disc bulge. No significant central canal narrowing.  No 
significant right neural foraminal narrowing. No significant left neural 
foraminal narrowing.  
L1-L2: Trace disc bulge. No significant central canal narrowing.  No significant 
right neural foraminal narrowing. No significant left neural foraminal 
narrowing.  
L2-L3: Disc bulge. No significant central canal narrowing.  No significant right 
neural foraminal narrowing. No significant left neural foraminal narrowing.  
L3-L4: Disc bulge. Bilateral facet hypertrophy. No significant central canal 
narrowing.  No significant right neural foraminal narrowing. Mild left neural 
foraminal narrowing.  
L4-L5: Disc bulge. Bilateral facet hypertrophy. No significant central canal 
narrowing.  No significant right neural foraminal narrowing. Mild left neural 
foraminal narrowing.  
L5-S1: Bilateral facet hypertrophy. Trace disc bulge. No significant central 
canal narrowing.  No significant right neural foraminal narrowing. No 
significant left neural foraminal narrowing.  
-------------------------------------------------------------------------------- 
------
IMPRESSION: 1.  Discogenic/degenerative changes as above. 
2.  No significant central canal narrowing at any level.  
3.  No moderate or severe neural foraminal narrowing at any level.

## 2022-10-20 IMAGING — MR MRI CERVICAL SPINE WITHOUT CONTRAST
5 of 7 series · 23 of 48 positions shown · IV contrast (gadolinium)
Comparison: None.

________________________________________________________________________________________________ 
MRI CERVICAL SPINE WITHOUT CONTRAST, 10/20/2022 [DATE]: 
CLINICAL INDICATION: Neck pain.
TECHNIQUE: Multiplanar, multiecho position MR images of the cervical spine were 
performed without intravenous gadolinium enhancement. Patient was scanned on a 
1.5T magnet.

[Series 201: survey · axial · 10.0mm · 1.25mm/px · z∈[-142,+42]mm · 3 of 10 slices shown]
[im 1/10]
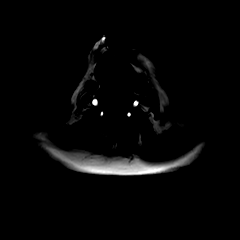
[im 5/10]
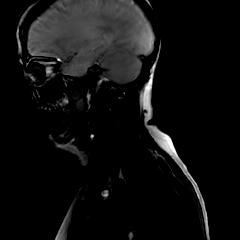
[im 10/10]
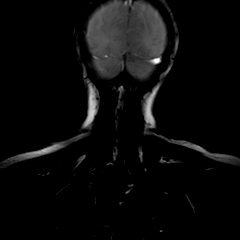

[Series 301: t2w_cor-surv · coronal · 5.0mm · 0.69mm/px · 2 of 9 slices shown]
[im 1/9]
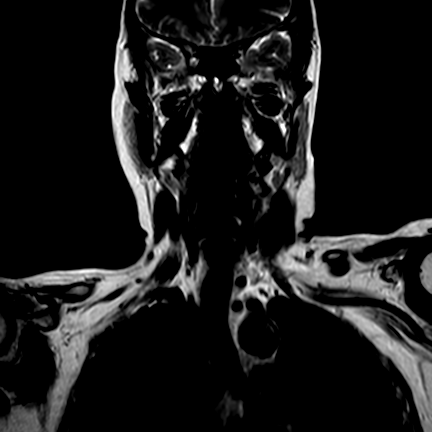
[im 9/9]
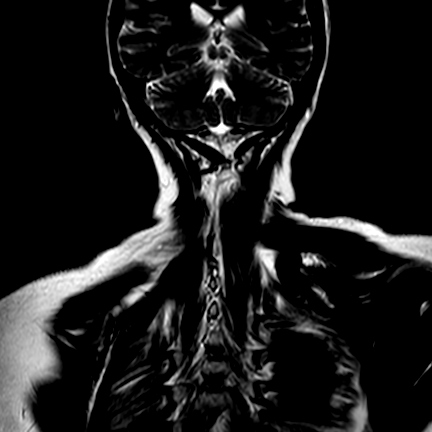

[Series 401: t1_sag · sagittal · 3.0mm · 0.39mm/px · 6 of 15 slices shown]
[im 1/15]
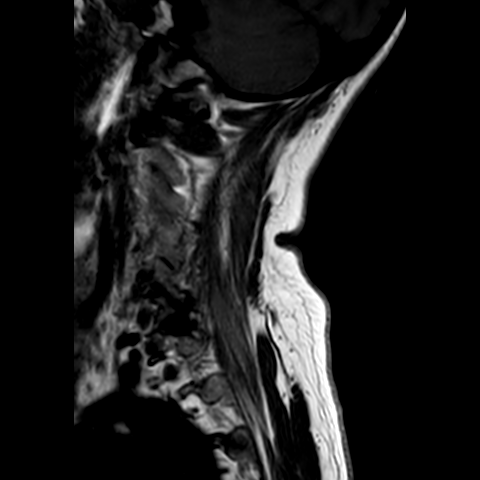
[im 3/15]
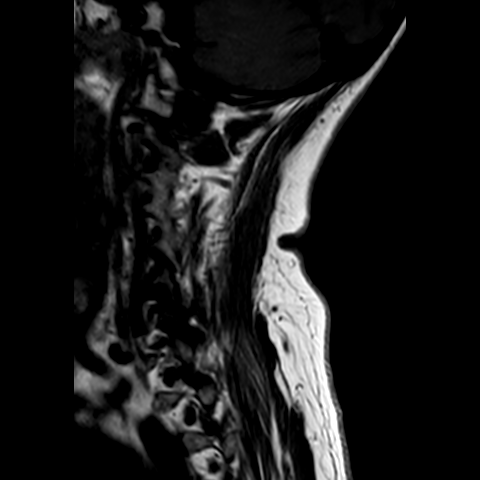
[im 6/15]
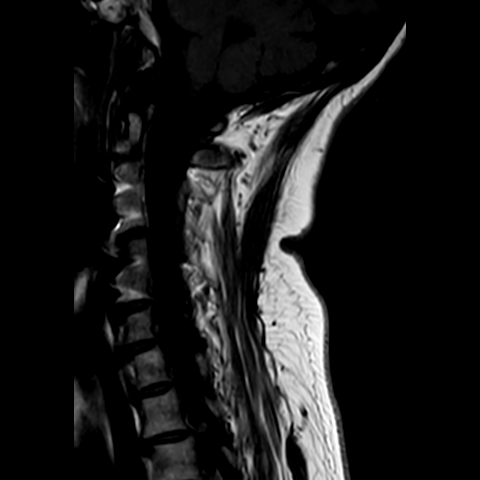
[im 9/15]
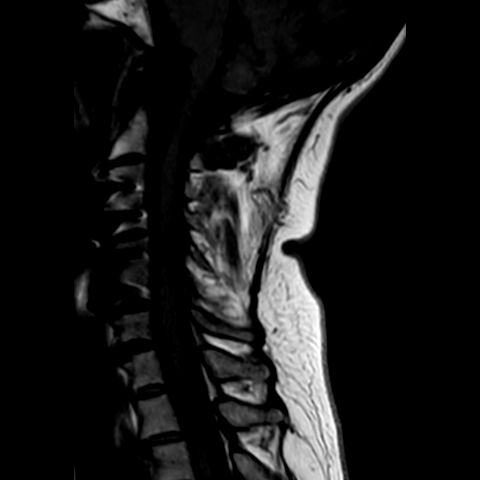
[im 12/15]
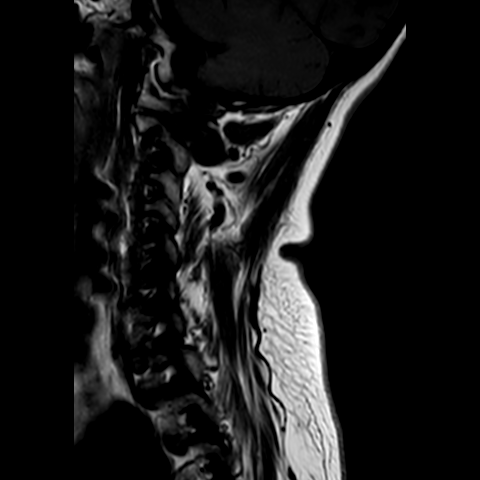
[im 15/15]
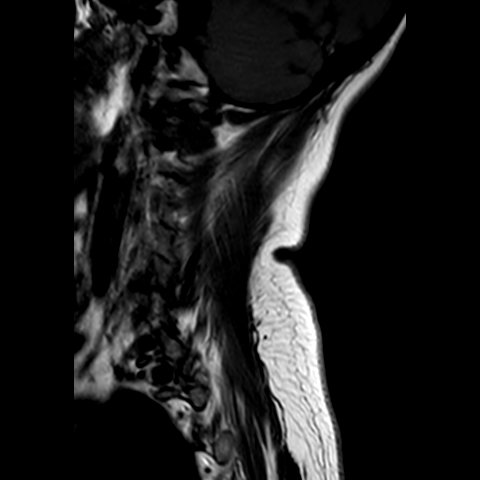

[Series 502: (id)_mdixon_tse · sagittal · 3.0mm · 0.42mm/px · 3 of 15 slices shown]
[im 1/15]
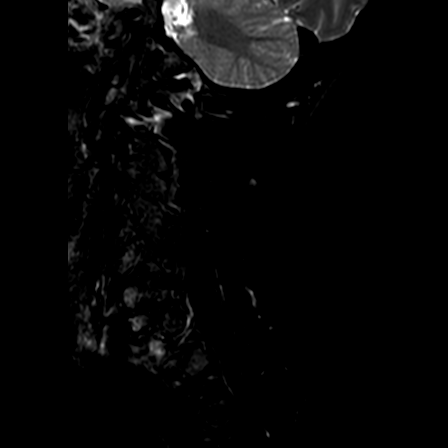
[im 3/15]
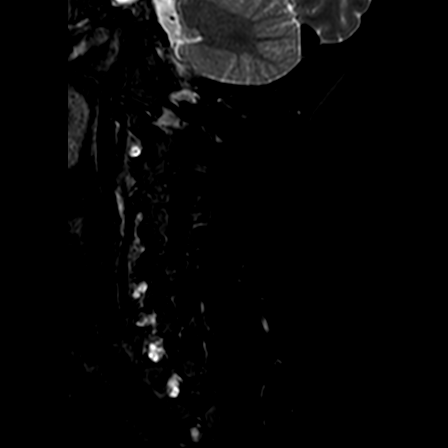
[im 6/15]
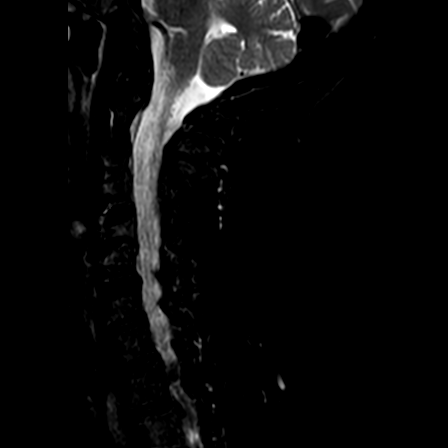

[Series 701: T2 · axial · 3.0mm · 0.31mm/px · z∈[-217,-128]mm · 9 of 32 slices shown]
[im 1/32]
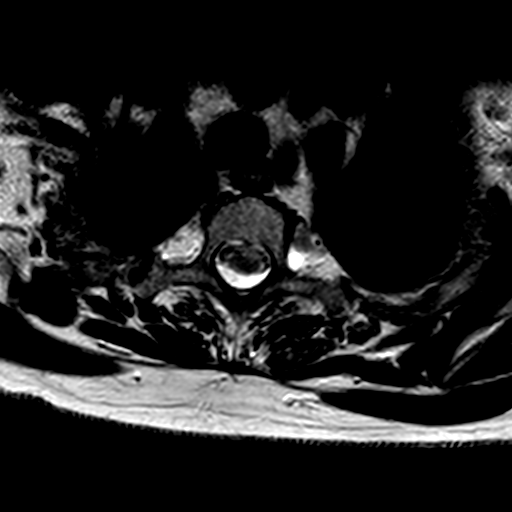
[im 6/32]
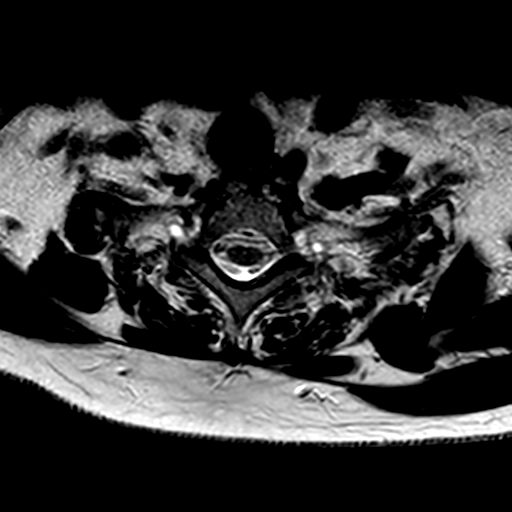
[im 9/32]
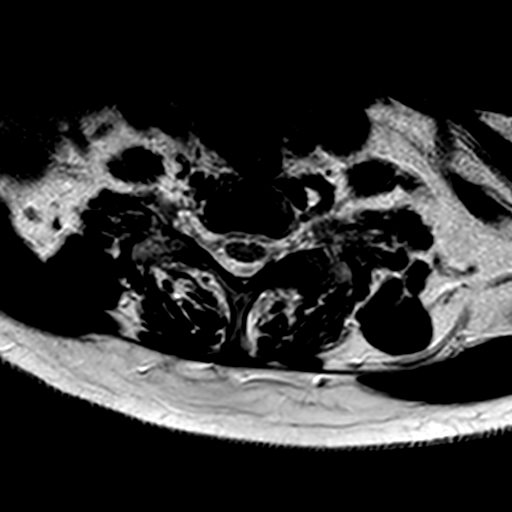
[im 15/32]
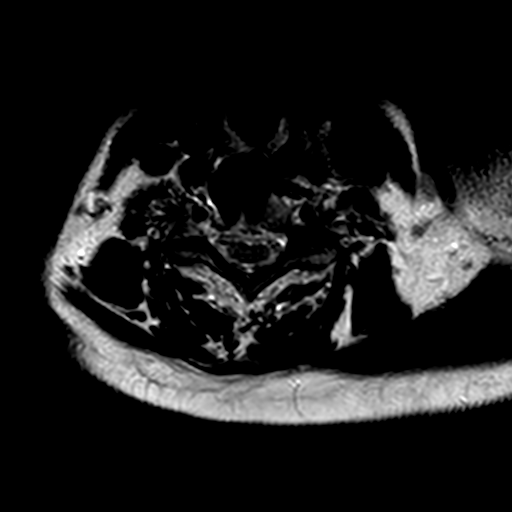
[im 17/32]
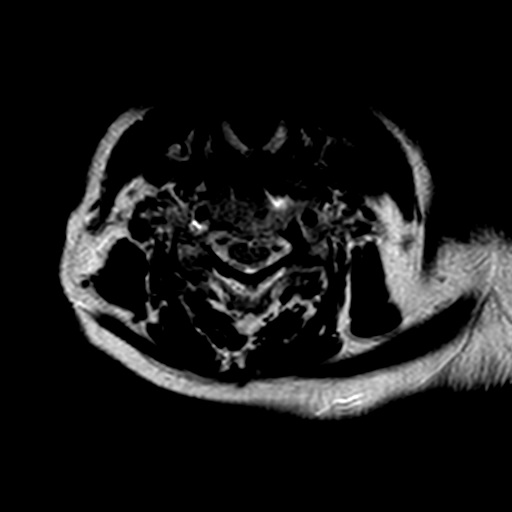
[im 23/32]
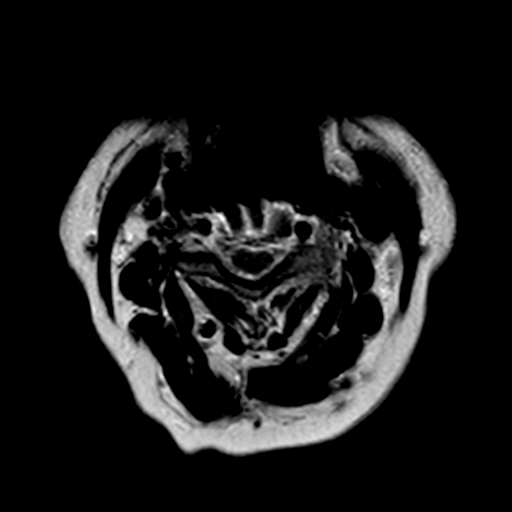
[im 26/32]
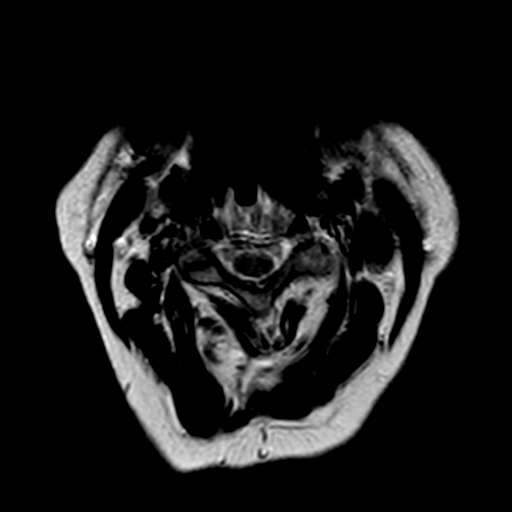
[im 29/32]
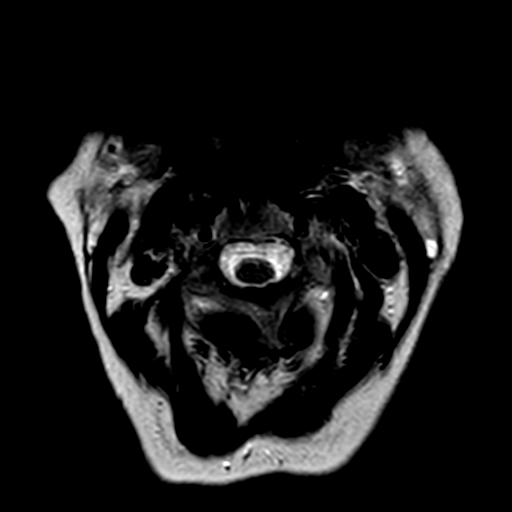
[im 32/32]
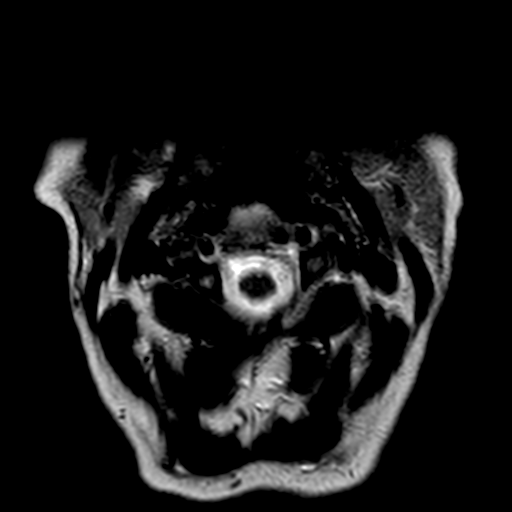

[23 of 48 positions shown; findings below may reference images not displayed]

FINDINGS: -------------------------------------------------------------------------------- 
----------------- 
GENERAL: 
POSTSURGICAL: Status post anterior cervical discectomy and fusion at C3-C4, 
C5-C6. 
ALIGNMENT: Normal. 
VERTEBRAL BODY HEIGHT: Normal.  
MARROW SIGNAL: No focal suspect signal abnormality. 
CORD SIGNAL: Normal.  
ADDITIONAL FINDINGS: None. 
-------------------------------------------------------------------------------- 
---------------- 
SEGMENTAL: 
CRANIOCERVICAL JUNCTION: No significant stenosis. 
C2-C3: Osseous fusion across left facet joint. No significant central canal or 
neural foraminal narrowing.  
C3-C4: Fusion across this level. Osseous fusion across left facet joint. No 
significant central canal or neural foraminal narrowing.  
C4-C5: Osseous fusion across left facet joint. No significant central canal or 
neural foraminal narrowing.  
C5-C6: Fusion across this level. Left uncovertebral joint hypertrophy. No 
significant central canal narrowing. No significant right neural foraminal 
narrowing. Moderate left neural foraminal narrowing. 
C6-C7: Disc osteophyte complex. Left uncovertebral joint hypertrophy. Left facet 
hypertrophy. No significant central canal or neural foraminal narrowing. 
C7-T1: Left facet hypertrophy. No significant central canal or neural foraminal 
narrowing.  
-------------------------------------------------------------------------------- 
---------------
IMPRESSION: 1.  Discogenic/degenerative and postsurgical changes as above. 
2.  No moderate or severe central canal narrowing at any level.  
3.  Moderate left neural foraminal narrowing at C5-C6. 
4.  Consider CT cervical spine in order to ascertain hardware status.

## 2022-10-21 IMAGING — DX LUMBAR SPINE AP, LAT WITH FLEXION AND EXTEN
1 series · 4 of 4 positions shown · non-contrast
Comparison: 10/20/2022 MRI

________________________________________________________________________________________________ 
LUMBAR SPINE AP, LAT WITH FLEXION AND EXTEN, 10/21/2022 [DATE]: 
CLINICAL INDICATION: Radiculopathy, lumbar region.

[Series 1: AP · U · 0.14mm/px · 4 of 4 slices shown]
[im 1/4]
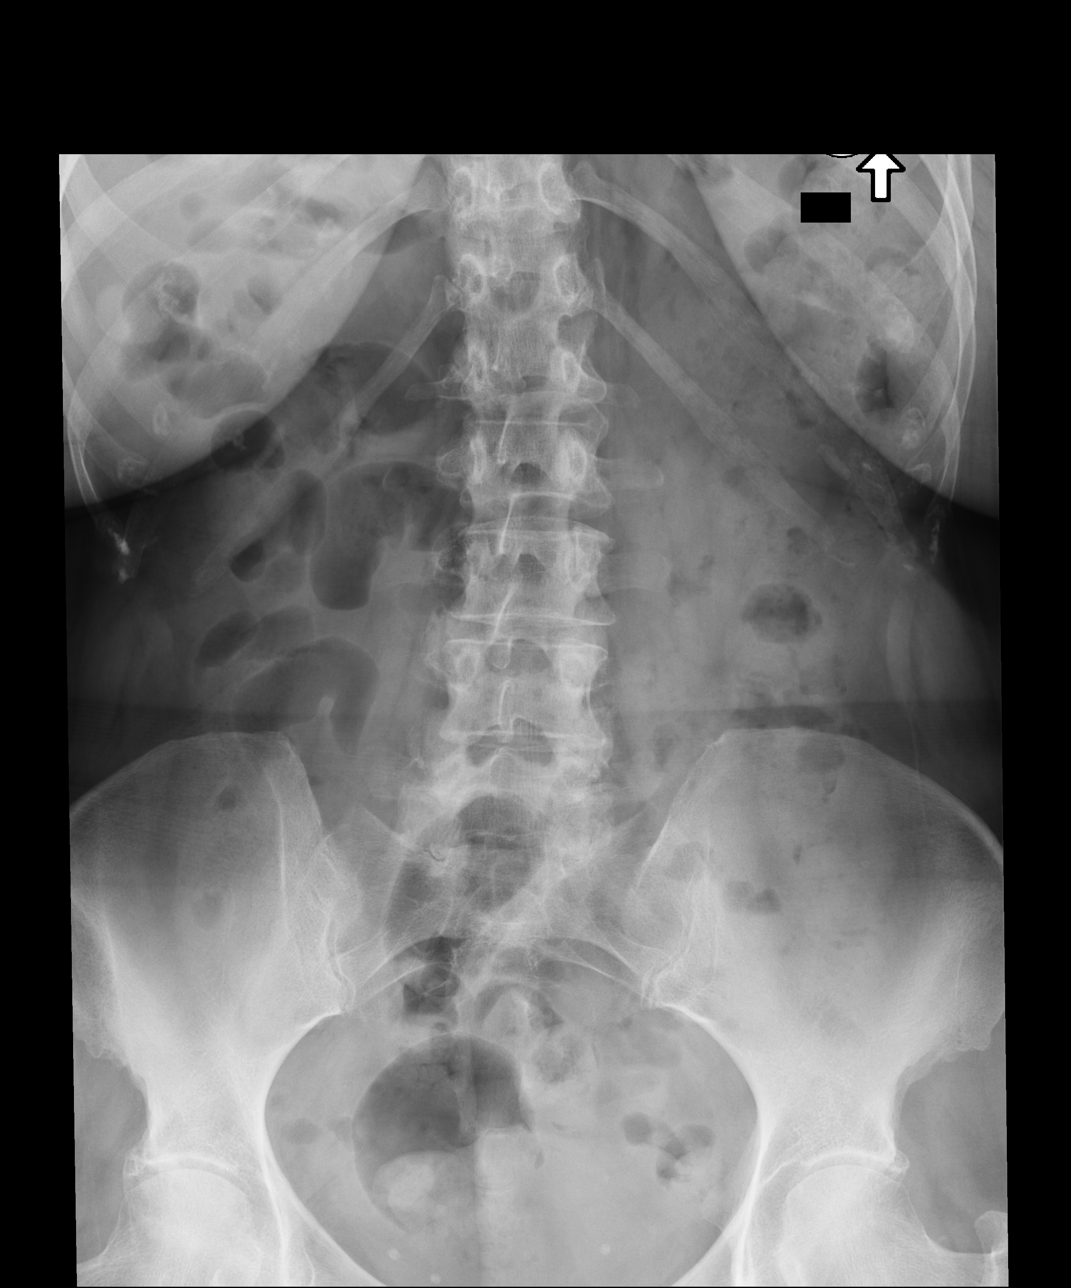
[im 2/4]
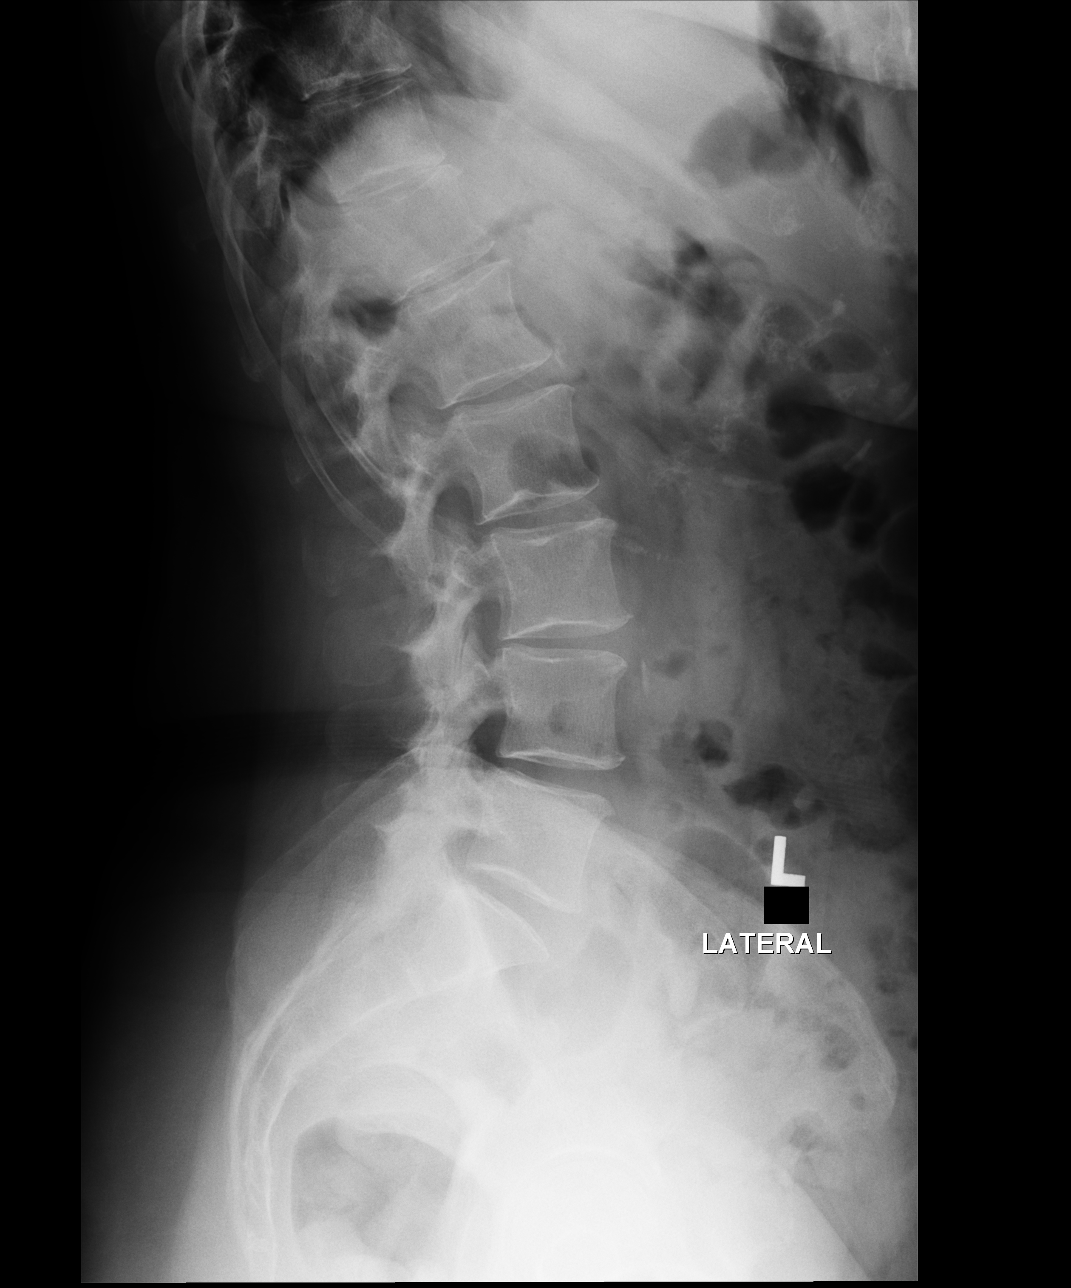
[im 3/4]
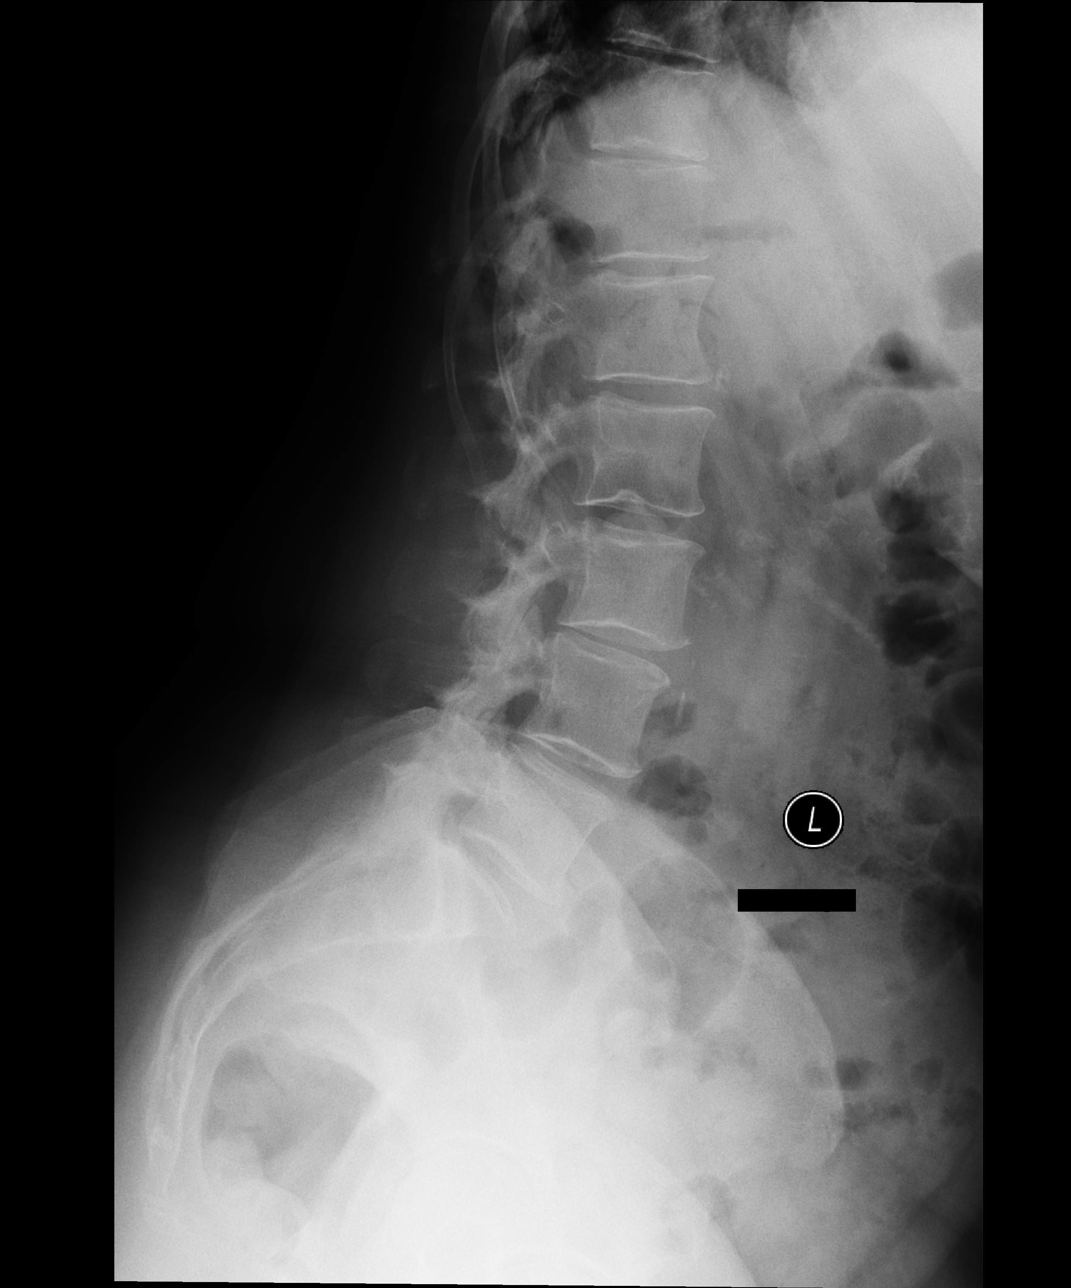
[im 4/4]
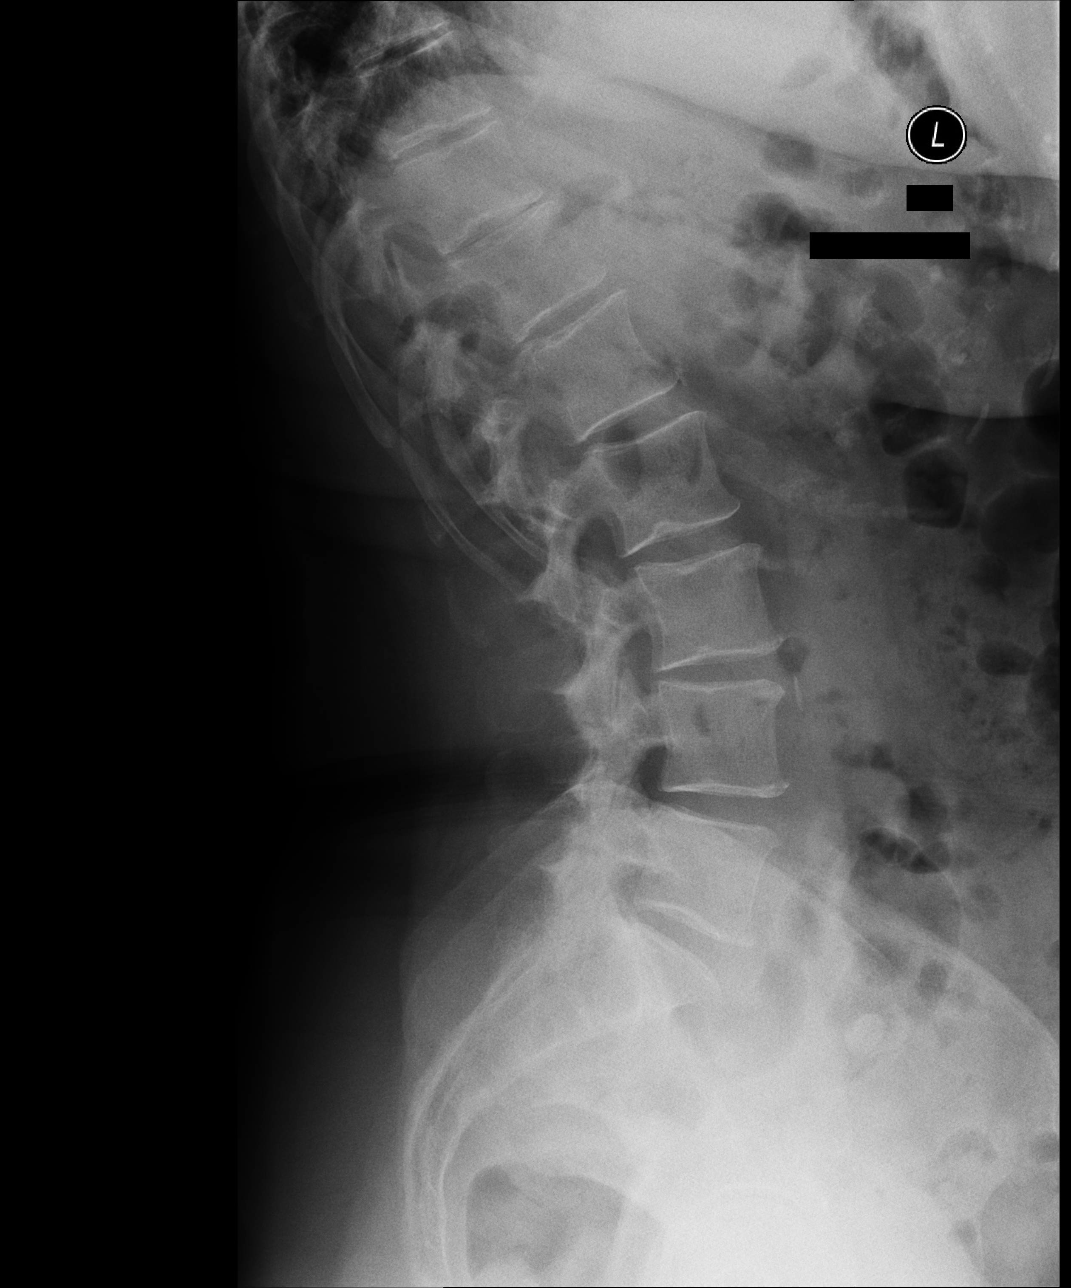

[4 of 4 positions shown; findings below may reference images not displayed]

FINDINGS: No fracture or subluxation. Mild disc space narrowing and the lower 
thoracic spine and L3-4. Lower lumbar/lumbosacral facet arthropathy. Small 
marginal osteophytes. Minimal rotatory levocurvature. Osteopenia. 
Atherosclerosis.
IMPRESSION: Mild degenerative change and osteopenia: DXA with TBS (trabecular bone score) 
may be helpful for further evaluation.

## 2022-10-21 IMAGING — DX CERVICAL SPINE 4 VIEWS
1 series · 4 of 4 positions shown · non-contrast
Comparison: MR exam of 10/20/2022

________________________________________________________________________________________________ 
CERVICAL SPINE 4 VIEWS, 10/21/2022 [DATE]: 
CLINICAL INDICATION: Radiculopathy, cervical region.

[Series 1: lateral · U · 0.14mm/px · 4 of 4 slices shown]
[im 1/4]
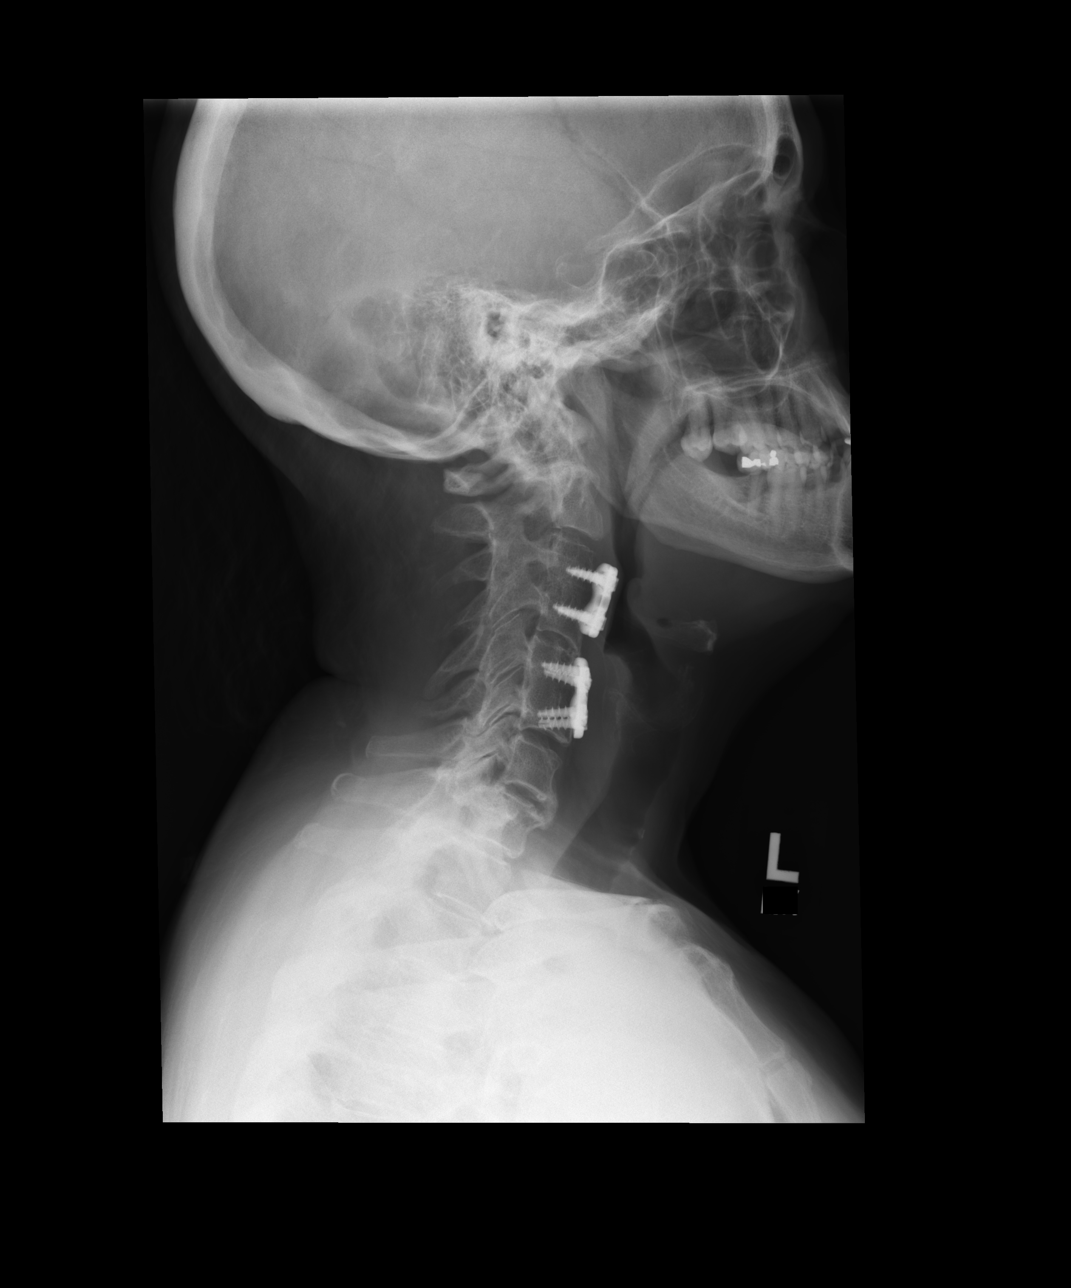
[im 2/4]
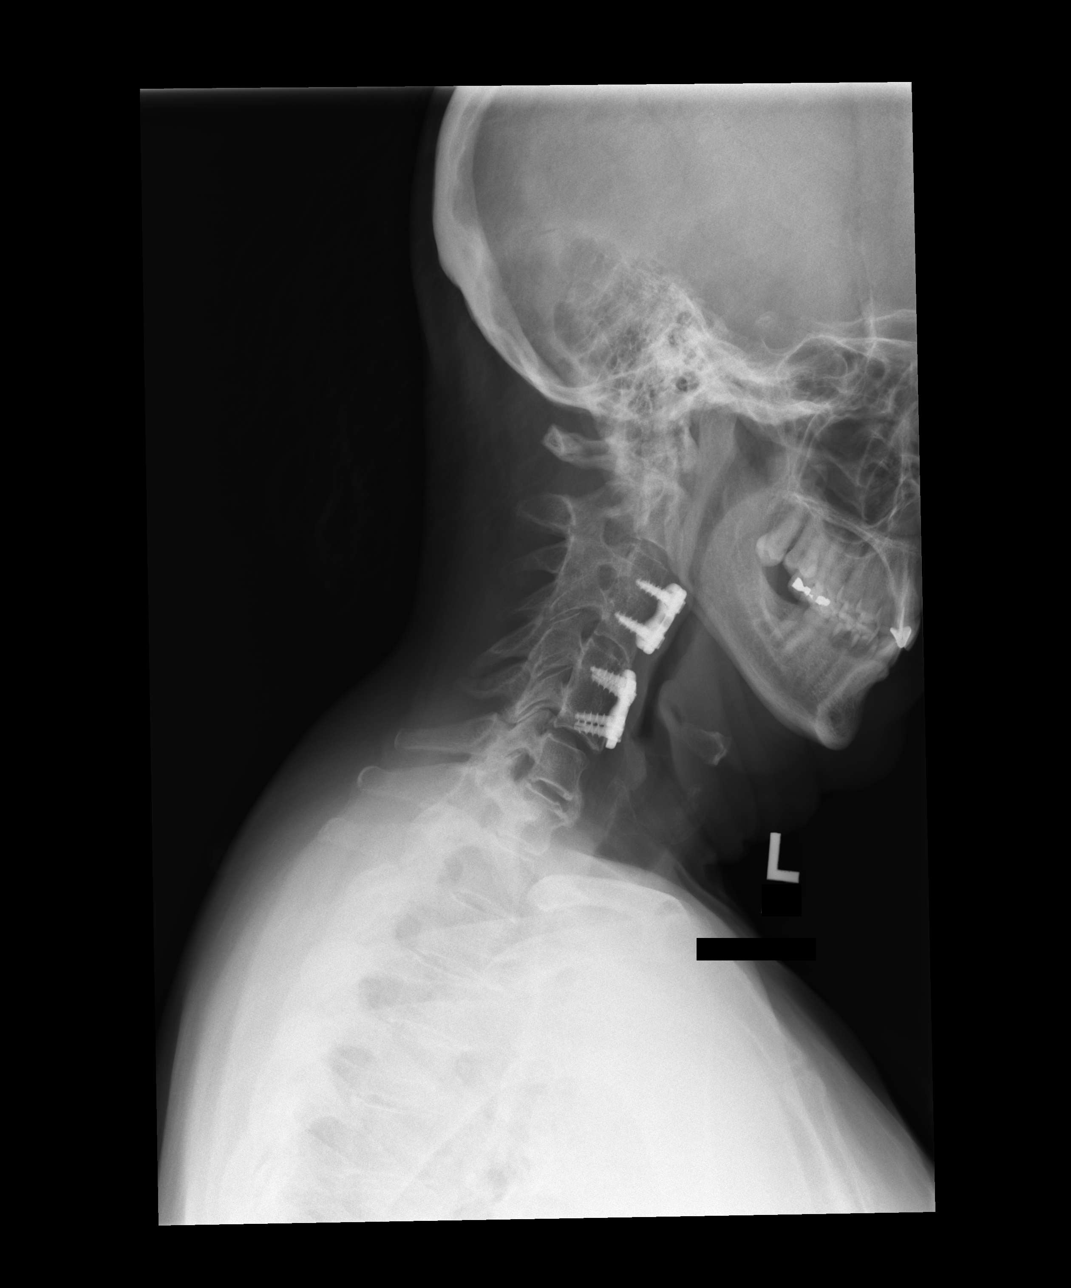
[im 3/4]
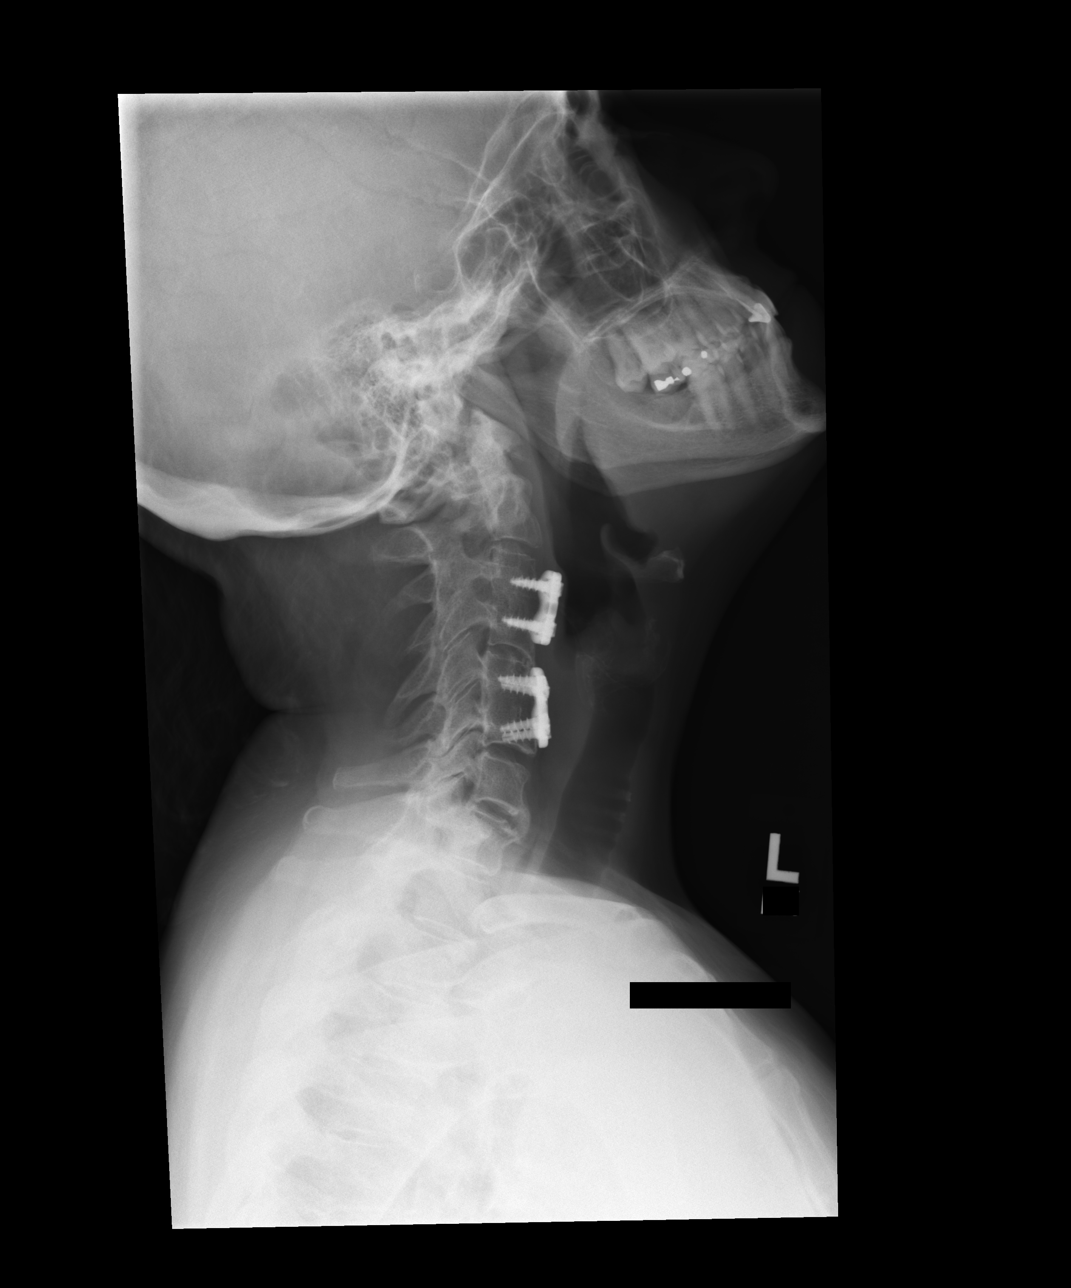
[im 4/4]
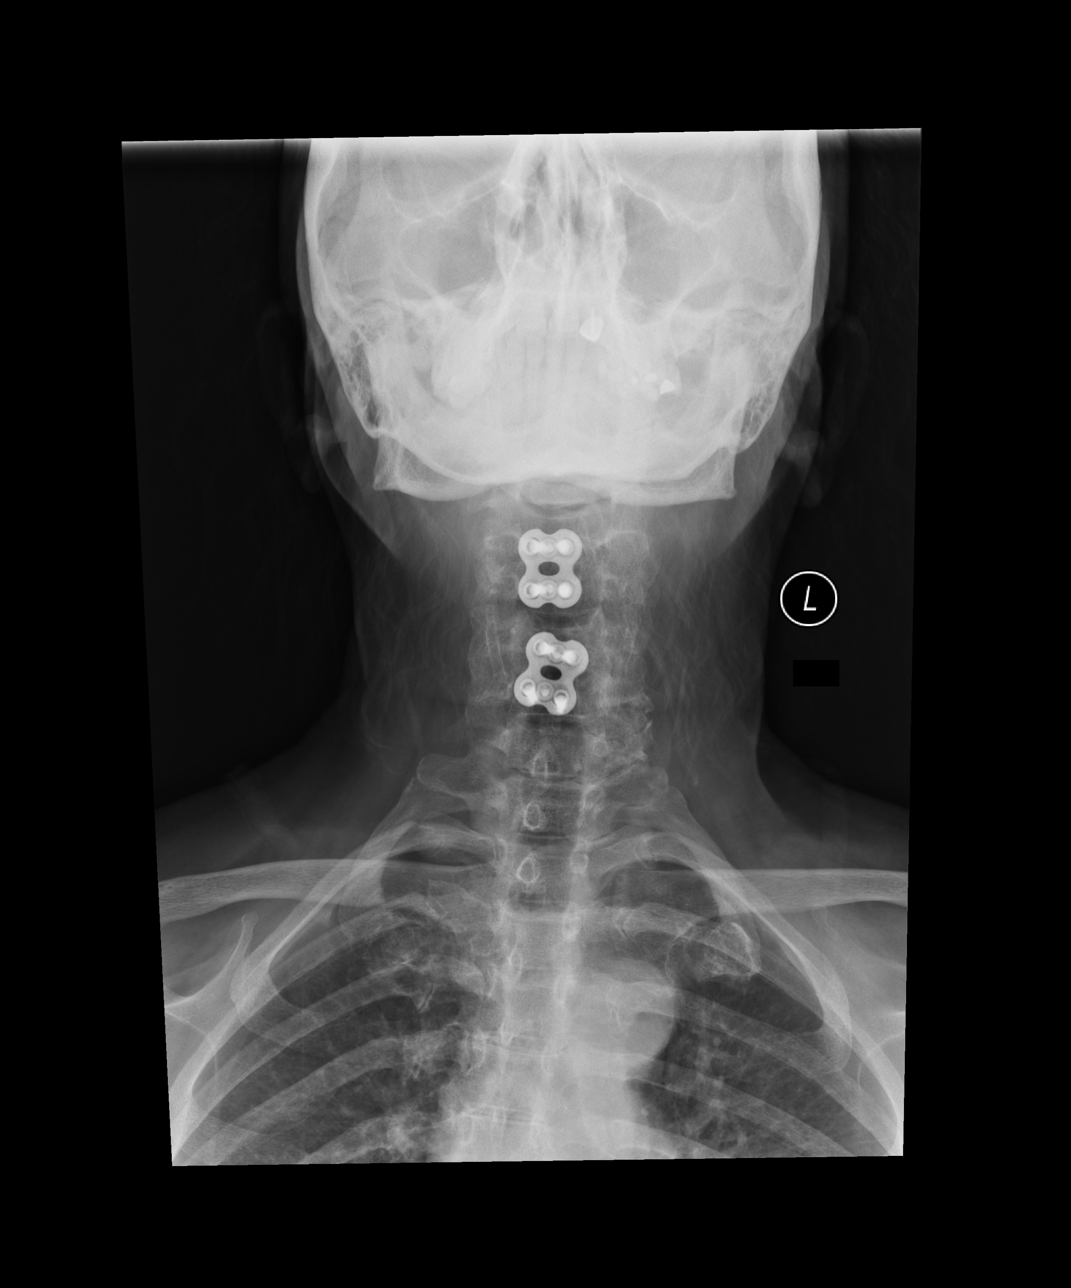

[4 of 4 positions shown; findings below may reference images not displayed]

FINDINGS: Post surgery changes ACDF C3-C4 and C5-C6. Hardware is intact. There 
is solid osseous interbody fusion C3-C4 and C5-C6. Scattered disc space 
narrowing and osteophytic spurring. Multilevel facet hypertrophy. No vertebral 
body fracture. No spondylolisthesis including with flexion or extension 
positioning. Lung apices are clear.
IMPRESSION: Postsurgical and spondylotic changes cervical spine.
